# Patient Record
Sex: Female | Born: 1960
Health system: Southern US, Community
[De-identification: ages and names within clinical notes are randomized; demographics above are authoritative.]

## PROBLEM LIST (undated history)

## (undated) DIAGNOSIS — C801 Malignant (primary) neoplasm, unspecified: Secondary | ICD-10-CM

## (undated) DIAGNOSIS — I10 Essential (primary) hypertension: Secondary | ICD-10-CM

## (undated) DIAGNOSIS — E7800 Pure hypercholesterolemia, unspecified: Secondary | ICD-10-CM

## (undated) DIAGNOSIS — M810 Age-related osteoporosis without current pathological fracture: Secondary | ICD-10-CM

## (undated) DIAGNOSIS — M1990 Unspecified osteoarthritis, unspecified site: Secondary | ICD-10-CM

## (undated) HISTORY — DX: Pure hypercholesterolemia, unspecified: E78.00

## (undated) HISTORY — PX: COLONOSCOPY: SHX174

## (undated) HISTORY — DX: Age-related osteoporosis without current pathological fracture: M81.0

## (undated) HISTORY — DX: Malignant (primary) neoplasm, unspecified: C80.1

## (undated) HISTORY — PX: PELVIC LAPAROSCOPY: SHX162

## (undated) HISTORY — PX: OTHER SURGICAL HISTORY: SHX169

## (undated) HISTORY — DX: Essential (primary) hypertension: I10

---

## 1997-09-27 ENCOUNTER — Inpatient Hospital Stay (HOSPITAL_COMMUNITY): Admission: AD | Admit: 1997-09-27 | Discharge: 1997-09-27 | Payer: Self-pay | Admitting: Obstetrics and Gynecology

## 1998-02-23 ENCOUNTER — Encounter: Admission: RE | Admit: 1998-02-23 | Discharge: 1998-05-24 | Payer: Self-pay | Admitting: Gynecology

## 1998-07-11 ENCOUNTER — Inpatient Hospital Stay (HOSPITAL_COMMUNITY): Admission: AD | Admit: 1998-07-11 | Discharge: 1998-07-14 | Payer: Self-pay | Admitting: Gynecology

## 1998-07-18 ENCOUNTER — Encounter (HOSPITAL_COMMUNITY): Admission: RE | Admit: 1998-07-18 | Discharge: 1998-10-16 | Payer: Self-pay | Admitting: Gynecology

## 1998-09-01 ENCOUNTER — Other Ambulatory Visit: Admission: RE | Admit: 1998-09-01 | Discharge: 1998-09-01 | Payer: Self-pay | Admitting: Gynecology

## 2000-01-16 ENCOUNTER — Other Ambulatory Visit: Admission: RE | Admit: 2000-01-16 | Discharge: 2000-01-16 | Payer: Self-pay | Admitting: Obstetrics and Gynecology

## 2000-06-20 ENCOUNTER — Ambulatory Visit (HOSPITAL_BASED_OUTPATIENT_CLINIC_OR_DEPARTMENT_OTHER): Admission: RE | Admit: 2000-06-20 | Discharge: 2000-06-20 | Payer: Self-pay | Admitting: Plastic Surgery

## 2000-06-20 ENCOUNTER — Encounter (INDEPENDENT_AMBULATORY_CARE_PROVIDER_SITE_OTHER): Payer: Self-pay | Admitting: Specialist

## 2000-09-23 ENCOUNTER — Other Ambulatory Visit: Admission: RE | Admit: 2000-09-23 | Discharge: 2000-09-23 | Payer: Self-pay | Admitting: Gynecology

## 2000-10-02 ENCOUNTER — Encounter: Admission: RE | Admit: 2000-10-02 | Discharge: 2000-12-31 | Payer: Self-pay | Admitting: Gynecology

## 2001-04-23 ENCOUNTER — Inpatient Hospital Stay (HOSPITAL_COMMUNITY): Admission: AD | Admit: 2001-04-23 | Discharge: 2001-04-25 | Payer: Self-pay | Admitting: Gynecology

## 2001-06-04 ENCOUNTER — Other Ambulatory Visit: Admission: RE | Admit: 2001-06-04 | Discharge: 2001-06-04 | Payer: Self-pay | Admitting: Obstetrics and Gynecology

## 2002-05-27 ENCOUNTER — Other Ambulatory Visit: Admission: RE | Admit: 2002-05-27 | Discharge: 2002-05-27 | Payer: Self-pay | Admitting: Obstetrics and Gynecology

## 2003-09-21 ENCOUNTER — Other Ambulatory Visit: Admission: RE | Admit: 2003-09-21 | Discharge: 2003-09-21 | Payer: Self-pay | Admitting: Obstetrics and Gynecology

## 2003-10-05 ENCOUNTER — Ambulatory Visit (HOSPITAL_COMMUNITY): Admission: RE | Admit: 2003-10-05 | Discharge: 2003-10-05 | Payer: Self-pay | Admitting: Obstetrics and Gynecology

## 2003-10-21 ENCOUNTER — Encounter: Admission: RE | Admit: 2003-10-21 | Discharge: 2003-10-21 | Payer: Self-pay | Admitting: Obstetrics and Gynecology

## 2004-10-11 ENCOUNTER — Encounter: Admission: RE | Admit: 2004-10-11 | Discharge: 2004-10-11 | Payer: Self-pay | Admitting: Obstetrics and Gynecology

## 2004-10-18 ENCOUNTER — Other Ambulatory Visit: Admission: RE | Admit: 2004-10-18 | Discharge: 2004-10-18 | Payer: Self-pay | Admitting: Obstetrics and Gynecology

## 2005-10-22 ENCOUNTER — Other Ambulatory Visit: Admission: RE | Admit: 2005-10-22 | Discharge: 2005-10-22 | Payer: Self-pay | Admitting: Obstetrics and Gynecology

## 2005-10-23 ENCOUNTER — Encounter: Admission: RE | Admit: 2005-10-23 | Discharge: 2005-10-23 | Payer: Self-pay | Admitting: Obstetrics and Gynecology

## 2006-10-25 ENCOUNTER — Other Ambulatory Visit: Admission: RE | Admit: 2006-10-25 | Discharge: 2006-10-25 | Payer: Self-pay | Admitting: Obstetrics and Gynecology

## 2006-11-06 ENCOUNTER — Encounter: Admission: RE | Admit: 2006-11-06 | Discharge: 2006-11-06 | Payer: Self-pay | Admitting: Obstetrics and Gynecology

## 2007-11-04 ENCOUNTER — Other Ambulatory Visit: Admission: RE | Admit: 2007-11-04 | Discharge: 2007-11-04 | Payer: Self-pay | Admitting: Obstetrics and Gynecology

## 2007-11-25 ENCOUNTER — Ambulatory Visit (HOSPITAL_COMMUNITY): Admission: RE | Admit: 2007-11-25 | Discharge: 2007-11-25 | Payer: Self-pay | Admitting: Obstetrics and Gynecology

## 2008-11-05 ENCOUNTER — Encounter: Payer: Self-pay | Admitting: Obstetrics and Gynecology

## 2008-11-05 ENCOUNTER — Ambulatory Visit: Payer: Self-pay | Admitting: Obstetrics and Gynecology

## 2008-11-05 ENCOUNTER — Other Ambulatory Visit: Admission: RE | Admit: 2008-11-05 | Discharge: 2008-11-05 | Payer: Self-pay | Admitting: Obstetrics and Gynecology

## 2008-11-25 ENCOUNTER — Ambulatory Visit: Payer: Self-pay | Admitting: Obstetrics and Gynecology

## 2008-11-25 ENCOUNTER — Ambulatory Visit (HOSPITAL_COMMUNITY): Admission: RE | Admit: 2008-11-25 | Discharge: 2008-11-25 | Payer: Self-pay | Admitting: Obstetrics and Gynecology

## 2008-12-13 ENCOUNTER — Ambulatory Visit: Payer: Self-pay | Admitting: Obstetrics and Gynecology

## 2008-12-24 ENCOUNTER — Ambulatory Visit: Payer: Self-pay | Admitting: Obstetrics and Gynecology

## 2009-11-17 ENCOUNTER — Other Ambulatory Visit: Admission: RE | Admit: 2009-11-17 | Discharge: 2009-11-17 | Payer: Self-pay | Admitting: Obstetrics and Gynecology

## 2009-11-17 ENCOUNTER — Ambulatory Visit: Payer: Self-pay | Admitting: Obstetrics and Gynecology

## 2009-11-28 ENCOUNTER — Ambulatory Visit (HOSPITAL_COMMUNITY): Admission: RE | Admit: 2009-11-28 | Discharge: 2009-11-28 | Payer: Self-pay | Admitting: Obstetrics and Gynecology

## 2010-01-20 ENCOUNTER — Ambulatory Visit: Payer: Self-pay | Admitting: Obstetrics and Gynecology

## 2010-11-07 ENCOUNTER — Other Ambulatory Visit: Payer: Self-pay | Admitting: Obstetrics and Gynecology

## 2010-11-07 DIAGNOSIS — Z1231 Encounter for screening mammogram for malignant neoplasm of breast: Secondary | ICD-10-CM

## 2010-11-22 ENCOUNTER — Other Ambulatory Visit (HOSPITAL_COMMUNITY)
Admission: RE | Admit: 2010-11-22 | Discharge: 2010-11-22 | Disposition: A | Discharge status: Home / Self Care | Payer: PRIVATE HEALTH INSURANCE | Source: Ambulatory Visit | Attending: Obstetrics and Gynecology | Admitting: Obstetrics and Gynecology

## 2010-11-22 ENCOUNTER — Other Ambulatory Visit: Payer: Self-pay | Admitting: Obstetrics and Gynecology

## 2010-11-22 ENCOUNTER — Encounter (INDEPENDENT_AMBULATORY_CARE_PROVIDER_SITE_OTHER): Payer: PRIVATE HEALTH INSURANCE | Admitting: Obstetrics and Gynecology

## 2010-11-22 DIAGNOSIS — Z01419 Encounter for gynecological examination (general) (routine) without abnormal findings: Secondary | ICD-10-CM

## 2010-11-22 DIAGNOSIS — N912 Amenorrhea, unspecified: Secondary | ICD-10-CM

## 2010-11-22 DIAGNOSIS — Z124 Encounter for screening for malignant neoplasm of cervix: Secondary | ICD-10-CM | POA: Insufficient documentation

## 2010-12-12 ENCOUNTER — Ambulatory Visit (HOSPITAL_COMMUNITY)
Admission: RE | Admit: 2010-12-12 | Discharge: 2010-12-12 | Disposition: A | Discharge status: Home / Self Care | Payer: PRIVATE HEALTH INSURANCE | Source: Ambulatory Visit | Attending: Obstetrics and Gynecology | Admitting: Obstetrics and Gynecology

## 2010-12-12 DIAGNOSIS — Z1231 Encounter for screening mammogram for malignant neoplasm of breast: Secondary | ICD-10-CM

## 2010-12-13 ENCOUNTER — Ambulatory Visit (INDEPENDENT_AMBULATORY_CARE_PROVIDER_SITE_OTHER): Payer: PRIVATE HEALTH INSURANCE | Admitting: Obstetrics and Gynecology

## 2010-12-13 ENCOUNTER — Other Ambulatory Visit: Payer: Self-pay | Admitting: Obstetrics and Gynecology

## 2010-12-13 ENCOUNTER — Other Ambulatory Visit: Payer: PRIVATE HEALTH INSURANCE

## 2010-12-13 DIAGNOSIS — N938 Other specified abnormal uterine and vaginal bleeding: Secondary | ICD-10-CM

## 2010-12-13 DIAGNOSIS — N950 Postmenopausal bleeding: Secondary | ICD-10-CM

## 2010-12-13 DIAGNOSIS — N949 Unspecified condition associated with female genital organs and menstrual cycle: Secondary | ICD-10-CM

## 2011-01-05 NOTE — Discharge Summary (Signed)
[facility]  Patient:    Autumn Morrison, Autumn Morrison Visit Number: [id number] MRN: [id number]          Service Type: OBS Location: [id number] Attending Physician:  Merrily Pew Dictated by:   Antony Contras, N.P. Admit Date:  04/23/2001 Discharge Date: 04/25/2001                             Discharge Summary  DISCHARGE DIAGNOSES:          Intrauterine pregnancy at term, spontaneous onset of labor, normal spontaneous vaginal delivery over midline episiotomy with partial third degree laceration, delivery of viable infant.  HISTORY OF PRESENT ILLNESS:   The patient is a 50 year old gravida 2, para 1-0-0-1 with an LMP of July 16, 2000, [employer] April 22, 2001.  Prenatal risk factors include advanced maternal age, anemia, positive BV in first trimester.  PRENATAL LABORATORIES:        Blood type B+.  Antibody screen negative.  RPR, HBSAG, HIV nonreactive.  Rubella immune.  MSAFP normal.  Amniocentesis normal chromosomes.  GBS negative.  HOSPITAL COURSE:              Patient was admitted on April 23, 2001 with spontaneous onset of labor.  She progressed to complete dilatation and delivered an Apgar 9/9 female infant weighing 8 pounds 7 ounces over a midline episiotomy with a partial third degree.  Postpartum course patient remained afebrile.  Was able to be discharged in satisfactory condition on her second postpartum day.  LABORATORIES:                 CBC:  Hematocrit 23, hemoglobin 7.9, WBC 10.3, platelets 190,000.  DISPOSITION:                  Follow-up in six weeks.  Continue prenatal vitamins and iron, Motrin and Tylox for pain. Dictated by:   Antony Contras, N.P. Attending Physician:  Merrily Pew DD:  05/16/01 TD:  05/16/01 Job: [phone] [id number]

## 2011-12-19 ENCOUNTER — Other Ambulatory Visit: Payer: Self-pay | Admitting: Obstetrics and Gynecology

## 2011-12-19 ENCOUNTER — Encounter: Payer: Self-pay | Admitting: Gynecology

## 2011-12-19 DIAGNOSIS — Z1231 Encounter for screening mammogram for malignant neoplasm of breast: Secondary | ICD-10-CM

## 2011-12-19 DIAGNOSIS — E7800 Pure hypercholesterolemia, unspecified: Secondary | ICD-10-CM | POA: Insufficient documentation

## 2011-12-19 DIAGNOSIS — I10 Essential (primary) hypertension: Secondary | ICD-10-CM | POA: Insufficient documentation

## 2012-01-01 ENCOUNTER — Encounter: Payer: Self-pay | Admitting: Obstetrics and Gynecology

## 2012-01-01 ENCOUNTER — Ambulatory Visit (INDEPENDENT_AMBULATORY_CARE_PROVIDER_SITE_OTHER): Payer: Managed Care, Other (non HMO) | Admitting: Obstetrics and Gynecology

## 2012-01-01 VITALS — BP 140/80 | Ht 64.0 in | Wt 120.0 lb

## 2012-01-01 DIAGNOSIS — Z01419 Encounter for gynecological examination (general) (routine) without abnormal findings: Secondary | ICD-10-CM

## 2012-01-01 DIAGNOSIS — M8580 Other specified disorders of bone density and structure, unspecified site: Secondary | ICD-10-CM

## 2012-01-01 DIAGNOSIS — M949 Disorder of cartilage, unspecified: Secondary | ICD-10-CM

## 2012-01-01 DIAGNOSIS — M899 Disorder of bone, unspecified: Secondary | ICD-10-CM

## 2012-01-01 NOTE — Progress Notes (Signed)
Patient came to see me today for her annual GYN exam. She has scheduled her yearly mammogram. Her last bone density showed low bone mass without an elevated FRAX risk and she is due for followup bone density now. She has a normal vitamin D. She takes calcium and d. She has had no fractures. She has had a previous FSH of 106. She has now not had a cycle since last August. She is having hot flashes. She has very mild vaginal dryness. She is having no bleeding. She is having no pelvic pain or dyspareunia. For the moment she feels that her hot flashes did not require intervention. She does her lab through her PCP.  Physical examination: Kennon Portela present HEENT within normal limits. Neck: Thyroid not large. No masses. Supraclavicular nodes: not enlarged. Breasts: Examined in both sitting and lying  position. No skin changes and no masses. Abdomen: Soft no guarding rebound or masses or hernia. Pelvic: External: Within normal limits. BUS: Within normal limits. Vaginal:within normal limits. Good estrogen effect. No evidence of cystocele rectocele or enterocele. Cervix: clean. Uterus: Normal size and shape. Adnexa: No masses. Rectovaginal exam: Confirmatory and negative. Extremities: Within normal limits.  Assessment: Mild menopausal symptoms. Low bone mass.  Plan: Call when necessary need for HRT. We had a long discussion of pros and cons today. Mammogram. Bone density.

## 2012-01-02 LAB — URINALYSIS W MICROSCOPIC + REFLEX CULTURE
Bacteria, UA: NONE SEEN
Casts: NONE SEEN
Glucose, UA: NEGATIVE mg/dL
Hgb urine dipstick: NEGATIVE
Ketones, ur: NEGATIVE mg/dL
Nitrite: NEGATIVE
Protein, ur: NEGATIVE mg/dL

## 2012-01-03 ENCOUNTER — Other Ambulatory Visit: Payer: Self-pay | Admitting: Obstetrics and Gynecology

## 2012-01-03 DIAGNOSIS — N390 Urinary tract infection, site not specified: Secondary | ICD-10-CM

## 2012-01-03 MED ORDER — NITROFURANTOIN MONOHYD MACRO 100 MG PO CAPS: 100.0000 mg | ORAL_CAPSULE | Freq: Two times a day (BID) | ORAL | Status: AC

## 2012-01-04 LAB — URINE CULTURE: Colony Count: >=100000

## 2012-01-10 ENCOUNTER — Ambulatory Visit (HOSPITAL_COMMUNITY)
Admission: RE | Admit: 2012-01-10 | Discharge: 2012-01-10 | Disposition: A | Discharge status: Home / Self Care | Payer: Managed Care, Other (non HMO) | Source: Ambulatory Visit | Attending: Obstetrics and Gynecology | Admitting: Obstetrics and Gynecology

## 2012-01-10 DIAGNOSIS — Z1231 Encounter for screening mammogram for malignant neoplasm of breast: Secondary | ICD-10-CM

## 2012-01-17 ENCOUNTER — Other Ambulatory Visit: Payer: Managed Care, Other (non HMO)

## 2012-01-17 DIAGNOSIS — N390 Urinary tract infection, site not specified: Secondary | ICD-10-CM

## 2012-01-18 LAB — URINALYSIS W MICROSCOPIC + REFLEX CULTURE
Bacteria, UA: NONE SEEN
Bilirubin Urine: NEGATIVE
Crystals: NONE SEEN
Hgb urine dipstick: NEGATIVE
Ketones, ur: NEGATIVE mg/dL
Nitrite: NEGATIVE
Protein, ur: NEGATIVE mg/dL
Specific Gravity, Urine: 1.013 (ref 1.005–1.030)
Urobilinogen, UA: 0.2 mg/dL (ref 0.0–1.0)

## 2013-01-07 ENCOUNTER — Other Ambulatory Visit: Payer: Self-pay | Admitting: Gynecology

## 2013-01-07 DIAGNOSIS — Z1231 Encounter for screening mammogram for malignant neoplasm of breast: Secondary | ICD-10-CM

## 2013-01-16 ENCOUNTER — Ambulatory Visit (HOSPITAL_COMMUNITY)
Admission: RE | Admit: 2013-01-16 | Discharge: 2013-01-16 | Disposition: A | Discharge status: Home / Self Care | Payer: Medicare HMO | Source: Ambulatory Visit | Attending: Gynecology | Admitting: Gynecology

## 2013-01-16 DIAGNOSIS — Z1231 Encounter for screening mammogram for malignant neoplasm of breast: Secondary | ICD-10-CM

## 2013-01-20 ENCOUNTER — Other Ambulatory Visit: Payer: Self-pay | Admitting: *Deleted

## 2013-01-20 DIAGNOSIS — N6310 Unspecified lump in the right breast, unspecified quadrant: Secondary | ICD-10-CM

## 2013-01-20 DIAGNOSIS — R928 Other abnormal and inconclusive findings on diagnostic imaging of breast: Secondary | ICD-10-CM

## 2013-02-05 ENCOUNTER — Ambulatory Visit
Admission: RE | Admit: 2013-02-05 | Discharge: 2013-02-05 | Disposition: A | Discharge status: Home / Self Care | Payer: Managed Care, Other (non HMO) | Source: Ambulatory Visit | Attending: Gynecology | Admitting: Gynecology

## 2013-02-05 DIAGNOSIS — R928 Other abnormal and inconclusive findings on diagnostic imaging of breast: Secondary | ICD-10-CM

## 2013-02-05 DIAGNOSIS — N6310 Unspecified lump in the right breast, unspecified quadrant: Secondary | ICD-10-CM

## 2013-03-30 ENCOUNTER — Ambulatory Visit (INDEPENDENT_AMBULATORY_CARE_PROVIDER_SITE_OTHER): Payer: Managed Care, Other (non HMO) | Admitting: Gynecology

## 2013-03-30 ENCOUNTER — Encounter: Payer: Self-pay | Admitting: Gynecology

## 2013-03-30 VITALS — BP 120/74 | Ht 65.0 in | Wt 121.0 lb

## 2013-03-30 DIAGNOSIS — M8580 Other specified disorders of bone density and structure, unspecified site: Secondary | ICD-10-CM

## 2013-03-30 DIAGNOSIS — N898 Other specified noninflammatory disorders of vagina: Secondary | ICD-10-CM

## 2013-03-30 DIAGNOSIS — Z01419 Encounter for gynecological examination (general) (routine) without abnormal findings: Secondary | ICD-10-CM

## 2013-03-30 DIAGNOSIS — Z13220 Encounter for screening for lipoid disorders: Secondary | ICD-10-CM

## 2013-03-30 DIAGNOSIS — M899 Disorder of bone, unspecified: Secondary | ICD-10-CM

## 2013-03-30 LAB — WET PREP FOR TRICH, YEAST, CLUE
Trich, Wet Prep: NONE SEEN
Yeast Wet Prep HPF POC: NONE SEEN

## 2013-03-30 NOTE — Patient Instructions (Signed)
Followup for bone density as arranged. Schedule colonoscopy with [facility] gastroenterology at [phone] or [facility] gastroenterology at [phone]

## 2013-03-30 NOTE — Progress Notes (Signed)
TAHLIA DEAMER May 16, 1961 [id number]        52 y.o.  [id number] for annual exam.  Former patient of Dr. Eda Paschal. Several issues noted below.  Past medical history,surgical history, medications, allergies, family history and social history were all reviewed and documented in the EPIC chart.  ROS:  Performed and pertinent positives and negatives are included in the history, assessment and plan .  Exam: Kim assistant Filed Vitals:   03/30/13 1523  BP: 120/74  Height: 5\' 5"  (1.651 m)  Weight: 121 lb (54.885 kg)   General appearance  Normal Skin grossly normal Head/Neck normal with no cervical or supraclavicular adenopathy thyroid normal Lungs  clear Cardiac RR, without RMG Abdominal  soft, nontender, without masses, organomegaly or hernia Breasts  examined lying and sitting without masses, retractions, discharge or axillary adenopathy. Pelvic  Ext/BUS/vagina  normal with atrophic changes  Cervix  normal with mild atrophic changes  Uterus  anteverted, normal size, shape and contour, midline and mobile nontender   Adnexa  Without masses or tenderness    Anus and perineum  normal   Rectovaginal  normal sphincter tone without palpated masses or tenderness.    Assessment/Plan:  52 y.o. [id number] female for annual exam.   1. Postmenopausal. LMP 2 years ago. Elevated FSH in the past. Occasional menopausal symptoms such as hot flashes or sweats but not overly bothersome to the patient. No issues with dyspareunia or vaginal dryness. We'll continue to monitor at present. Patient knows to report any vaginal bleeding. 2. Vaginal moisture for several days. No overt discharge itching or odor. No UTI symptoms. Wet prep is negative. Will observe at present. She does not feel that its urine. If continues or worsens she'll represent for evaluation. Check UA today. 3. Osteopenia. DEXA 11/2008 with T score -2.2 FRAX 3.5%/0.8%. Never followed up for repeat. Recommend repeat DEXA now and she agrees to do so.  Check vitamin D level today. Increase calcium vitamin D recommendations reviewed. 4. Pap smear 2012. No Pap smear done today. No history of abnormal Pap smears previously. And repeat Pap smear next year 3 year interval. 5. Mammography 01/2013. Continue with annual mammography. SBE monthly reviewed. 6. Colonoscopy never. Patient turned 52 this year I recommended arranging screening colonoscopy and she agrees with this. 7. Health maintenance. Has not had routine blood work done recently. Will check baseline CBC comprehensive metabolic panel lipid profile urinalysis and vitamin D. Followup for DEXA as arranged otherwise annually.  Note: This document was prepared with digital dictation and possible smart phrase technology. Any transcriptional errors that result from this process are unintentional.   Dara Lords MD, 3:53 PM 03/30/2013

## 2013-03-31 ENCOUNTER — Other Ambulatory Visit: Payer: Managed Care, Other (non HMO)

## 2013-03-31 ENCOUNTER — Encounter: Payer: Self-pay | Admitting: Obstetrics and Gynecology

## 2013-03-31 LAB — LIPID PANEL
Cholesterol: 205 mg/dL — ABNORMAL HIGH (ref 0–200)
HDL: 58 mg/dL (ref >39)
LDL Cholesterol: 135 mg/dL — ABNORMAL HIGH (ref 0–99)
Total CHOL/HDL Ratio: 3.5 Ratio
Triglycerides: 60 mg/dL (ref <150)
VLDL: 12 mg/dL (ref 0–40)

## 2013-03-31 LAB — CBC WITH DIFFERENTIAL/PLATELET
Basophils Relative: 0 % (ref 0–1)
HCT: 41.1 % (ref 36.0–46.0)
Hemoglobin: 13.9 g/dL (ref 12.0–15.0)
Lymphocytes Relative: 47 % — ABNORMAL HIGH (ref 12–46)
Lymphs Abs: 2.3 10*3/uL (ref 0.7–4.0)
MCHC: 33.8 g/dL (ref 30.0–36.0)
Monocytes Absolute: 0.2 10*3/uL (ref 0.1–1.0)
Monocytes Relative: 3 % (ref 3–12)
Neutro Abs: 2.4 10*3/uL (ref 1.7–7.7)
Neutrophils Relative %: 49 % (ref 43–77)
RBC: 4.84 MIL/uL (ref 3.87–5.11)
WBC: 4.9 10*3/uL (ref 4.0–10.5)

## 2013-03-31 LAB — COMPREHENSIVE METABOLIC PANEL
Alkaline Phosphatase: 69 U/L (ref 39–117)
CO2: 28 mEq/L (ref 19–32)
Creat: 0.72 mg/dL (ref 0.50–1.10)
Glucose, Bld: 92 mg/dL (ref 70–99)
Total Bilirubin: 0.4 mg/dL (ref 0.3–1.2)

## 2013-03-31 LAB — URINALYSIS W MICROSCOPIC + REFLEX CULTURE
Bacteria, UA: NONE SEEN
Bilirubin Urine: NEGATIVE
Crystals: NONE SEEN
Hgb urine dipstick: NEGATIVE
Ketones, ur: NEGATIVE mg/dL
Protein, ur: NEGATIVE mg/dL
Urobilinogen, UA: 0.2 mg/dL (ref 0.0–1.0)

## 2013-04-01 LAB — VITAMIN D 25 HYDROXY (VIT D DEFICIENCY, FRACTURES): Vit D, 25-Hydroxy: 44 ng/mL (ref 30–89)

## 2013-05-21 ENCOUNTER — Ambulatory Visit (INDEPENDENT_AMBULATORY_CARE_PROVIDER_SITE_OTHER): Payer: Managed Care, Other (non HMO)

## 2013-05-21 DIAGNOSIS — M8580 Other specified disorders of bone density and structure, unspecified site: Secondary | ICD-10-CM

## 2013-05-21 DIAGNOSIS — M810 Age-related osteoporosis without current pathological fracture: Secondary | ICD-10-CM | POA: Insufficient documentation

## 2013-05-25 ENCOUNTER — Telehealth: Payer: Self-pay | Admitting: Gynecology

## 2013-05-25 ENCOUNTER — Encounter: Payer: Self-pay | Admitting: Gynecology

## 2013-05-25 DIAGNOSIS — M810 Age-related osteoporosis without current pathological fracture: Secondary | ICD-10-CM

## 2013-05-25 NOTE — Telephone Encounter (Signed)
Left message for pt to call.

## 2013-05-25 NOTE — Telephone Encounter (Signed)
Recommend office visit to discuss her bone density which shows osteoporosis. Recommend comprehensive metabolic panel, TSH, PTH, vitamin D before hand

## 2013-06-02 NOTE — Telephone Encounter (Signed)
Pt informed labs ordered and will schedule consult when comes in for lab.

## 2013-06-04 ENCOUNTER — Ambulatory Visit: Payer: Managed Care, Other (non HMO)

## 2013-06-04 DIAGNOSIS — M810 Age-related osteoporosis without current pathological fracture: Secondary | ICD-10-CM

## 2013-06-04 LAB — TSH: TSH: 1.069 u[IU]/mL (ref 0.350–4.500)

## 2013-06-04 LAB — COMPREHENSIVE METABOLIC PANEL
ALT: 13 U/L (ref 0–35)
Alkaline Phosphatase: 67 U/L (ref 39–117)
CO2: 29 mEq/L (ref 19–32)
Calcium: 9.1 mg/dL (ref 8.4–10.5)
Chloride: 100 mEq/L (ref 96–112)
Sodium: 136 mEq/L (ref 135–145)
Total Bilirubin: 0.4 mg/dL (ref 0.3–1.2)
Total Protein: 7.1 g/dL (ref 6.0–8.3)

## 2013-06-08 ENCOUNTER — Other Ambulatory Visit: Payer: Self-pay | Admitting: Gynecology

## 2013-06-08 ENCOUNTER — Other Ambulatory Visit: Payer: Managed Care, Other (non HMO)

## 2013-06-08 DIAGNOSIS — M810 Age-related osteoporosis without current pathological fracture: Secondary | ICD-10-CM

## 2013-06-09 LAB — PTH, INTACT AND CALCIUM: Calcium: 9.7 mg/dL (ref 8.4–10.5)

## 2013-06-16 ENCOUNTER — Encounter: Payer: Self-pay | Admitting: Gynecology

## 2013-06-16 ENCOUNTER — Ambulatory Visit (INDEPENDENT_AMBULATORY_CARE_PROVIDER_SITE_OTHER): Payer: Managed Care, Other (non HMO) | Admitting: Gynecology

## 2013-06-16 DIAGNOSIS — M810 Age-related osteoporosis without current pathological fracture: Secondary | ICD-10-CM

## 2013-06-16 MED ORDER — ALENDRONATE SODIUM 70 MG PO TABS: 70.0000 mg | ORAL_TABLET | ORAL | Status: DC

## 2013-06-16 NOTE — Patient Instructions (Signed)
Osteoporosis Throughout your life, your body breaks down old bone and replaces it with new bone. As you get older, your body does not replace bone as quickly as it breaks it down. By the age of 30 years, most people begin to gradually lose bone because of the imbalance between bone loss and replacement. Some people lose more bone than others. Bone loss beyond a specified normal degree is considered osteoporosis.  Osteoporosis affects the strength and durability of your bones. The inside of the ends of your bones and your flat bones, like the bones of your pelvis, look like honeycomb, filled with tiny open spaces. As bone loss occurs, your bones become less dense. This means that the open spaces inside your bones become bigger and the walls between these spaces become thinner. This makes your bones weaker. Bones of a person with osteoporosis can become so weak that they can break (fracture) during minor accidents, such as a simple fall. CAUSES  The following factors have been associated with the development of osteoporosis:  Smoking.  Drinking more than 2 alcoholic drinks several days per week.  Long-term use of certain medicines:  Corticosteroids.  Chemotherapy medicines.  Thyroid medicines.  Antiepileptic medicines.  Gonadal hormone suppression medicine.  Immunosuppression medicine.  Being underweight.  Lack of physical activity.  Lack of exposure to the sun. This can lead to vitamin D deficiency.  Certain medical conditions:  Certain inflammatory bowel diseases, such as Crohn's disease and ulcerative colitis.  Diabetes.  Hyperthyroidism.  Hyperparathyroidism. RISK FACTORS Anyone can develop osteoporosis. However, the following factors can increase your risk of developing osteoporosis:  Gender Women are at higher risk than men.  Age Being older than 50 years increases your risk.  Ethnicity White and Asian people have an increased risk.  Weight Being extremely  underweight can increase your risk of osteoporosis.  Family history of osteoporosis Having a family member who has developed osteoporosis can increase your risk. SYMPTOMS  Usually, people with osteoporosis have no symptoms.  DIAGNOSIS  Signs during a physical exam that may prompt your caregiver to suspect osteoporosis include:  Decreased height. This is usually caused by the compression of the bones that form your spine (vertebrae) because they have weakened and become fractured.  A curving or rounding of the upper back (kyphosis). To confirm signs of osteoporosis, your caregiver may request a procedure that uses 2 low-dose X-ray beams with different levels of energy to measure your bone mineral density (dual-energy X-ray absorptiometry [DXA]). Also, your caregiver may check your level of vitamin D. TREATMENT  The goal of osteoporosis treatment is to strengthen bones in order to decrease the risk of bone fractures. There are different types of medicines available to help achieve this goal. Some of these medicines work by slowing the processes of bone loss. Some medicines work by increasing bone density. Treatment also involves making sure that your levels of calcium and vitamin D are adequate. PREVENTION  There are things you can do to help prevent osteoporosis. Adequate intake of calcium and vitamin D can help you achieve optimal bone mineral density. Regular exercise can also help, especially resistance and weight-bearing activities. If you smoke, quitting smoking is an important part of osteoporosis prevention. MAKE SURE YOU:  Understand these instructions.  Will watch your condition.  Will get help right away if you are not doing well or get worse. Document Released: 05/16/2005 Document Revised: 07/23/2012 Document Reviewed: 07/21/2011 [employer] Patient Information 2014 [employer].  

## 2013-06-16 NOTE — Progress Notes (Signed)
Patient presents to discuss her most recent DEXA which shows T score -2.5 left femoral neck. There was a statistically significant decline from her prior DEXA. Z score normal. TSH PTH calcium and vitamin D levels normal. She does have a strong family history of osteoporosis  I reviewed the diagnosis of osteoporosis and the options for management. Nonpharmacologic such as weightbearing exercise adequate calcium vitamin D reviewed. Pharmacologic treatments to include bisphosphonates, Prolia, Forteo calcitonin HRT Evista discussed. After lengthy discussion as to the side effect profile and risks including GERD, esophageal cancer, osteonecrosis of the jaw, atypical fractures particularly with prolonged use the patient wants to start alendronate 70 mg weekly. I reviewed how to take the medication. She is going to check to see if Actonel would be cheap enough to consider she may call back to switch to monthly Actonel 150 mg. Plan repeat DEXA 2 years.

## 2013-06-25 ENCOUNTER — Other Ambulatory Visit: Payer: Self-pay

## 2013-06-30 ENCOUNTER — Telehealth: Payer: Self-pay | Admitting: *Deleted

## 2013-06-30 NOTE — Telephone Encounter (Signed)
Muscle aches can occur with the pill. I would try another dose and see how she does. If this continues to be an issue then we may want to talk about switching her to a different medication.

## 2013-06-30 NOTE — Telephone Encounter (Signed)
Pt started on fosamax 70 mg weekly on 06/16/13 took first week of pill and done fine, pt said she took this week pill and has had some body aching. Pt said it comes & goes mainly aching in neck and tailbone area, pt asked if she should wait a little longer to be on medication? Recommendations? Please advise

## 2013-06-30 NOTE — Telephone Encounter (Signed)
Pt informed with the below note. She will follow up if needed. 

## 2013-12-08 ENCOUNTER — Other Ambulatory Visit: Payer: Self-pay | Admitting: Gynecology

## 2013-12-08 DIAGNOSIS — R921 Mammographic calcification found on diagnostic imaging of breast: Secondary | ICD-10-CM

## 2014-01-19 ENCOUNTER — Ambulatory Visit
Admission: RE | Admit: 2014-01-19 | Discharge: 2014-01-19 | Disposition: A | Discharge status: Home / Self Care | Payer: Private Health Insurance - Indemnity | Source: Ambulatory Visit | Attending: Gynecology | Admitting: Gynecology

## 2014-01-19 DIAGNOSIS — R921 Mammographic calcification found on diagnostic imaging of breast: Secondary | ICD-10-CM

## 2014-06-17 ENCOUNTER — Other Ambulatory Visit: Payer: Self-pay | Admitting: Gynecology

## 2014-06-21 ENCOUNTER — Encounter: Payer: Self-pay | Admitting: Gynecology

## 2014-09-14 ENCOUNTER — Other Ambulatory Visit (HOSPITAL_COMMUNITY)
Admission: RE | Admit: 2014-09-14 | Discharge: 2014-09-14 | Disposition: A | Discharge status: Home / Self Care | Payer: No Typology Code available for payment source | Source: Ambulatory Visit | Attending: Gynecology | Admitting: Gynecology

## 2014-09-14 ENCOUNTER — Encounter: Payer: Self-pay | Admitting: Gynecology

## 2014-09-14 ENCOUNTER — Ambulatory Visit (INDEPENDENT_AMBULATORY_CARE_PROVIDER_SITE_OTHER): Payer: No Typology Code available for payment source | Admitting: Gynecology

## 2014-09-14 VITALS — BP 140/86 | Ht 64.0 in | Wt 119.0 lb

## 2014-09-14 DIAGNOSIS — Z1151 Encounter for screening for human papillomavirus (HPV): Secondary | ICD-10-CM | POA: Diagnosis present

## 2014-09-14 DIAGNOSIS — Z01419 Encounter for gynecological examination (general) (routine) without abnormal findings: Secondary | ICD-10-CM | POA: Insufficient documentation

## 2014-09-14 DIAGNOSIS — M810 Age-related osteoporosis without current pathological fracture: Secondary | ICD-10-CM

## 2014-09-14 MED ORDER — ALENDRONATE SODIUM 70 MG PO TABS: ORAL_TABLET | ORAL | Status: DC

## 2014-09-14 NOTE — Progress Notes (Signed)
[place] 1961/06/08 [id number]        54 y.o.  [id number] for annual exam.  Several issues noted below.  Past medical history,surgical history, problem list, medications, allergies, family history and social history were all reviewed and documented as reviewed in the EPIC chart.  ROS:  Performed with pertinent positives and negatives included in the history, assessment and plan.   Additional significant findings :  none   Exam: Kim Counsellor Vitals:   09/14/14 1007  BP: 140/86  Height: 5\' 4"  (1.626 m)  Weight: 119 lb (53.978 kg)   General appearance:  Normal affect, orientation and appearance. Skin: Grossly normal HEENT: Without gross lesions.  No cervical or supraclavicular adenopathy. Thyroid normal.  Lungs:  Clear without wheezing, rales or rhonchi Cardiac: RR, without RMG Abdominal:  Soft, nontender, without masses, guarding, rebound, organomegaly or hernia Breasts:  Examined lying and sitting without masses, retractions, discharge or axillary adenopathy. Pelvic:  Ext/BUS/vagina with mild atrophic changes  Cervix normal. Pap smear/HPV done  Uterus axial, normal size, shape and contour, midline and mobile nontender   Adnexa  Without masses or tenderness    Anus and perineum  Normal   Rectovaginal  Normal sphincter tone without palpated masses or tenderness.    Assessment/Plan:  54 y.o. [id number] female for annual exam.   1. Postmenopausal. Doing well without significant hot flushes, night sweats, vaginal dryness or dyspareunia. No vaginal bleeding. Continue to monitor. Report any vaginal bleeding. 2. Osteoporosis. DEXA 2014 T score -2.5. It started on Fosamax 70 mg weekly last year. Prescription ran out and she did not continue since October. Refill 1 year provided. Patient will go ahead and start again. Need to stay on the medication stressed and call if prescription runs out. Plan repeat DEXA next year at two-year interval. 3. Mammography 01/2014. Continue with  annual mammography. SBE monthly reviewed. 4. Pap smear 2012.  Pap/HPV today. No history of significant abnormal Pap smears.  Repeat at 3-5 year interval assuming this Pap smear is normal. 5. Colonoscopy never. Patient's planning to schedule this coming year. 6. Health maintenance. Patient plans routine blood work through Dr. Fayrene Fearing office. Mild elevated blood pressure reviewed with her. She had been on antihypertensives in the past. Going to follow up with Dr. Melford Aase reference to this. Follow up in one year, sooner as needed.     [name] MD, 10:28 AM 09/14/2014

## 2014-09-14 NOTE — Patient Instructions (Signed)
Schedule your colonoscopy.  You may obtain a copy of any labs that were done today by logging onto MyChart as outlined in the instructions provided with your AVS (after visit summary). The office will not call with normal lab results but certainly if there are any significant abnormalities then we will contact you.   Health Maintenance, Female A healthy lifestyle and preventative care can promote health and wellness.  Maintain regular health, dental, and eye exams.  Eat a healthy diet. Foods like vegetables, fruits, whole grains, low-fat dairy products, and lean protein foods contain the nutrients you need without too many calories. Decrease your intake of foods high in solid fats, added sugars, and salt. Get information about a proper diet from your caregiver, if necessary.  Regular physical exercise is one of the most important things you can do for your health. Most adults should get at least 150 minutes of moderate-intensity exercise (any activity that increases your heart rate and causes you to sweat) each week. In addition, most adults need muscle-strengthening exercises on 2 or more days a week.   Maintain a healthy weight. The body mass index (BMI) is a screening tool to identify possible weight problems. It provides an estimate of body fat based on height and weight. Your caregiver can help determine your BMI, and can help you achieve or maintain a healthy weight. For adults 20 years and older:  A BMI below 18.5 is considered underweight.  A BMI of 18.5 to 24.9 is normal.  A BMI of 25 to 29.9 is considered overweight.  A BMI of 30 and above is considered obese.  Maintain normal blood lipids and cholesterol by exercising and minimizing your intake of saturated fat. Eat a balanced diet with plenty of fruits and vegetables. Blood tests for lipids and cholesterol should begin at age 55 and be repeated every 5 years. If your lipid or cholesterol levels are high, you are over 50, or you  are a high risk for heart disease, you may need your cholesterol levels checked more frequently.Ongoing high lipid and cholesterol levels should be treated with medicines if diet and exercise are not effective.  If you smoke, find out from your caregiver how to quit. If you do not use tobacco, do not start.  Lung cancer screening is recommended for adults aged 78 80 years who are at high risk for developing lung cancer because of a history of smoking. Yearly low-dose computed tomography (CT) is recommended for people who have at least a 30-pack-year history of smoking and are a current smoker or have quit within the past 15 years. A pack year of smoking is smoking an average of 1 pack of cigarettes a day for 1 year (for example: 1 pack a day for 30 years or 2 packs a day for 15 years). Yearly screening should continue until the smoker has stopped smoking for at least 15 years. Yearly screening should also be stopped for people who develop a health problem that would prevent them from having lung cancer treatment.  If you are pregnant, do not drink alcohol. If you are breastfeeding, be very cautious about drinking alcohol. If you are not pregnant and choose to drink alcohol, do not exceed 1 drink per day. One drink is considered to be 12 ounces (355 mL) of beer, 5 ounces (148 mL) of wine, or 1.5 ounces (44 mL) of liquor.  Avoid use of street drugs. Do not share needles with anyone. Ask for help if you need support  or instructions about stopping the use of drugs.  High blood pressure causes heart disease and increases the risk of stroke. Blood pressure should be checked at least every 1 to 2 years. Ongoing high blood pressure should be treated with medicines, if weight loss and exercise are not effective.  If you are 58 to 52 years old, ask your caregiver if you should take aspirin to prevent strokes.  Diabetes screening involves taking a blood sample to check your fasting blood sugar level. This should  be done once every 3 years, after age 67, if you are within normal weight and without risk factors for diabetes. Testing should be considered at a younger age or be carried out more frequently if you are overweight and have at least 1 risk factor for diabetes.  Breast cancer screening is essential preventative care for women. You should practice "breast self-awareness." This means understanding the normal appearance and feel of your breasts and may include breast self-examination. Any changes detected, no matter how small, should be reported to a caregiver. Women in their 25s and 30s should have a clinical breast exam (CBE) by a caregiver as part of a regular health exam every 1 to 3 years. After age 37, women should have a CBE every year. Starting at age 12, women should consider having a mammogram (breast X-ray) every year. Women who have a family history of breast cancer should talk to their caregiver about genetic screening. Women at a high risk of breast cancer should talk to their caregiver about having an MRI and a mammogram every year.  Breast cancer gene (BRCA)-related cancer risk assessment is recommended for women who have family members with BRCA-related cancers. BRCA-related cancers include breast, ovarian, tubal, and peritoneal cancers. Having family members with these cancers may be associated with an increased risk for harmful changes (mutations) in the breast cancer genes BRCA1 and BRCA2. Results of the assessment will determine the need for genetic counseling and BRCA1 and BRCA2 testing.  The Pap test is a screening test for cervical cancer. Women should have a Pap test starting at age 47. Between ages 60 and 16, Pap tests should be repeated every 2 years. Beginning at age 6, you should have a Pap test every 3 years as long as the past 3 Pap tests have been normal. If you had a hysterectomy for a problem that was not cancer or a condition that could lead to cancer, then you no longer need  Pap tests. If you are between ages 19 and 37, and you have had normal Pap tests going back 10 years, you no longer need Pap tests. If you have had past treatment for cervical cancer or a condition that could lead to cancer, you need Pap tests and screening for cancer for at least 20 years after your treatment. If Pap tests have been discontinued, risk factors (such as a new sexual partner) need to be reassessed to determine if screening should be resumed. Some women have medical problems that increase the chance of getting cervical cancer. In these cases, your caregiver may recommend more frequent screening and Pap tests.  The human papillomavirus (HPV) test is an additional test that may be used for cervical cancer screening. The HPV test looks for the virus that can cause the cell changes on the cervix. The cells collected during the Pap test can be tested for HPV. The HPV test could be used to screen women aged 24 years and older, and should be used in  women of any age who have unclear Pap test results. After the age of 68, women should have HPV testing at the same frequency as a Pap test.  Colorectal cancer can be detected and often prevented. Most routine colorectal cancer screening begins at the age of 49 and continues through age 70. However, your caregiver may recommend screening at an earlier age if you have risk factors for colon cancer. On a yearly basis, your caregiver may provide home test kits to check for hidden blood in the stool. Use of a small camera at the end of a tube, to directly examine the colon (sigmoidoscopy or colonoscopy), can detect the earliest forms of colorectal cancer. Talk to your caregiver about this at age 62, when routine screening begins. Direct examination of the colon should be repeated every 5 to 10 years through age 62, unless early forms of pre-cancerous polyps or small growths are found.  Hepatitis C blood testing is recommended for all people born from [birth year] through  1965 and any individual with known risks for hepatitis C.  Practice safe sex. Use condoms and avoid high-risk sexual practices to reduce the spread of sexually transmitted infections (STIs). Sexually active women aged 55 and younger should be checked for Chlamydia, which is a common sexually transmitted infection. Older women with new or multiple partners should also be tested for Chlamydia. Testing for other STIs is recommended if you are sexually active and at increased risk.  Osteoporosis is a disease in which the bones lose minerals and strength with aging. This can result in serious bone fractures. The risk of osteoporosis can be identified using a bone density scan. Women ages 24 and over and women at risk for fractures or osteoporosis should discuss screening with their caregivers. Ask your caregiver whether you should be taking a calcium supplement or vitamin D to reduce the rate of osteoporosis.  Menopause can be associated with physical symptoms and risks. Hormone replacement therapy is available to decrease symptoms and risks. You should talk to your caregiver about whether hormone replacement therapy is right for you.  Use sunscreen. Apply sunscreen liberally and repeatedly throughout the day. You should seek shade when your shadow is shorter than you. Protect yourself by wearing long sleeves, pants, a wide-brimmed hat, and sunglasses year round, whenever you are outdoors.  Notify your caregiver of new moles or changes in moles, especially if there is a change in shape or color. Also notify your caregiver if a mole is larger than the size of a pencil eraser.  Stay current with your immunizations. Document Released: 02/19/2011 Document Revised: 12/01/2012 Document Reviewed: 02/19/2011 [facility] Patient Information 2014 [employer].

## 2014-09-14 NOTE — Addendum Note (Signed)
Addended by: Nelva Nay on: 09/14/2014 10:52 AM   Modules accepted: Orders

## 2014-09-15 LAB — CYTOLOGY - PAP

## 2015-01-04 ENCOUNTER — Other Ambulatory Visit: Payer: Self-pay | Admitting: Gynecology

## 2015-01-04 DIAGNOSIS — Z1231 Encounter for screening mammogram for malignant neoplasm of breast: Secondary | ICD-10-CM

## 2015-01-24 ENCOUNTER — Ambulatory Visit (HOSPITAL_COMMUNITY)
Admission: RE | Admit: 2015-01-24 | Discharge: 2015-01-24 | Disposition: A | Discharge status: Home / Self Care | Payer: No Typology Code available for payment source | Source: Ambulatory Visit | Attending: Gynecology | Admitting: Gynecology

## 2015-01-24 DIAGNOSIS — Z1231 Encounter for screening mammogram for malignant neoplasm of breast: Secondary | ICD-10-CM | POA: Insufficient documentation

## 2015-06-24 ENCOUNTER — Encounter: Payer: Self-pay | Admitting: Internal Medicine

## 2015-06-24 ENCOUNTER — Telehealth: Payer: Self-pay | Admitting: *Deleted

## 2015-06-24 NOTE — Telephone Encounter (Signed)
Pt called requesting name and # to GI Md, I gave patient [facility] and [phone] to call to schedule screening colonscopy

## 2015-08-25 ENCOUNTER — Ambulatory Visit (AMBULATORY_SURGERY_CENTER): Payer: Self-pay

## 2015-08-25 VITALS — Ht 64.0 in | Wt 121.8 lb

## 2015-08-25 DIAGNOSIS — Z1211 Encounter for screening for malignant neoplasm of colon: Secondary | ICD-10-CM

## 2015-08-25 MED ORDER — SUPREP BOWEL PREP KIT 17.5-3.13-1.6 GM/177ML PO SOLN: 1.0000 | Freq: Once | ORAL | Status: DC

## 2015-08-25 NOTE — Progress Notes (Signed)
No allergies to eggs or soy No home oxygen No diet/weight loss meds No past problems with anesthesia   Has email and internet; registered for emmi

## 2015-09-07 ENCOUNTER — Telehealth: Payer: Self-pay

## 2015-09-07 NOTE — Telephone Encounter (Signed)
Patient's insurance has changed and no longer covers her prep.  I left a message saying I would leave suprep up front for her to pick up.  I asked her to call me back to let me know she got the message and when she could pick it up.

## 2015-09-08 ENCOUNTER — Ambulatory Visit (AMBULATORY_SURGERY_CENTER): Payer: BLUE CROSS/BLUE SHIELD | Admitting: Internal Medicine

## 2015-09-08 ENCOUNTER — Encounter: Payer: Self-pay | Admitting: Internal Medicine

## 2015-09-08 VITALS — BP 133/76 | HR 68 | Temp 96.9°F | Resp 30 | Ht 64.0 in | Wt 121.0 lb

## 2015-09-08 DIAGNOSIS — Z1211 Encounter for screening for malignant neoplasm of colon: Secondary | ICD-10-CM | POA: Diagnosis not present

## 2015-09-08 MED ORDER — SODIUM CHLORIDE 0.9 % IV SOLN: 500.0000 mL | INTRAVENOUS | Status: DC

## 2015-09-08 NOTE — Op Note (Signed)
[place]  [address] [place], [id number]   COLONOSCOPY PROCEDURE REPORT  PATIENT: Autumn Morrison, Autumn Morrison  MR#: [id number] BIRTHDATE: 09/22/1960 , 3  yrs. old GENDER: female ENDOSCOPIST: Jerene Bears, MD REFERRED [id number] Melford Aase, M.D. PROCEDURE DATE:  09/08/2015 PROCEDURE:   Colonoscopy, screening First Screening Colonoscopy - Avg.  risk and is 50 yrs.  old or older Yes.  Prior Negative Screening - Now for repeat screening. N/A  History of Adenoma - Now for follow-up colonoscopy & has been > or = to 3 yrs.  N/A  Polyps removed today? No Recommend repeat exam, <10 yrs? No ASA CLASS:   Class II INDICATIONS:Screening for colonic neoplasia and Colorectal Neoplasm Risk Assessment for this procedure is average risk. MEDICATIONS: Monitored anesthesia care and Propofol 200 mg IV  DESCRIPTION OF PROCEDURE:   After the risks benefits and alternatives of the procedure were thoroughly explained, informed consent was obtained.  The digital rectal exam revealed no abnormalities of the rectum.   The LB [id number]  endoscope was introduced through the anus and advanced to the cecum, which was identified by both the appendix and ileocecal valve. No adverse events experienced.   The quality of the prep was (Suprep was used) excellent.  The instrument was then slowly withdrawn as the colon was fully examined. Estimated blood loss is zero unless otherwise noted in this procedure report.      COLON FINDINGS: A normal appearing cecum, ileocecal valve, and appendiceal orifice were identified.  the ascending, transverse, descending, sigmoid colon, and rectum appeared unremarkable. Retroflexed views revealed small skin tags and no abnormalities. The time to cecum = 3.7 Withdrawal time = 8.6   The scope was withdrawn and the procedure completed.  COMPLICATIONS: There were no complications.  ENDOSCOPIC IMPRESSION: Normal colonoscopy  RECOMMENDATIONS: You should continue  to follow colorectal cancer screening guidelines for "routine risk" patients with a repeat colonoscopy in 10 years. There is no need for FOBT (stool) testing for at least 5 years.  eSigned:  Jerene Bears, MD 09/08/2015 9:31 AM   cc:  the patient, Dr. Melford Aase

## 2015-09-08 NOTE — Progress Notes (Signed)
Patient awakening,vss,report to rn 

## 2015-09-08 NOTE — Patient Instructions (Signed)
YOU HAD AN ENDOSCOPIC PROCEDURE TODAY AT THE [facility]:   Refer to the procedure report that was given to you for any specific questions about what was found during the examination.  If the procedure report does not answer your questions, please call your gastroenterologist to clarify.  If you requested that your care partner not be given the details of your procedure findings, then the procedure report has been included in a sealed envelope for you to review at your convenience later.  YOU SHOULD EXPECT: Some feelings of bloating in the abdomen. Passage of more gas than usual.  Walking can help get rid of the air that was put into your GI tract during the procedure and reduce the bloating. If you had a lower endoscopy (such as a colonoscopy or flexible sigmoidoscopy) you may notice spotting of blood in your stool or on the toilet paper. If you underwent a bowel prep for your procedure, you may not have a normal bowel movement for a few days.  Please Note:  You might notice some irritation and congestion in your nose or some drainage.  This is from the oxygen used during your procedure.  There is no need for concern and it should clear up in a day or so.  SYMPTOMS TO REPORT IMMEDIATELY:   Following lower endoscopy (colonoscopy or flexible sigmoidoscopy):  Excessive amounts of blood in the stool  Significant tenderness or worsening of abdominal pains  Swelling of the abdomen that is new, acute  Fever of 100F or higher   For urgent or emergent issues, a gastroenterologist can be reached at any hour by calling [phone].   DIET: Your first meal following the procedure should be a small meal and then it is ok to progress to your normal diet. Heavy or fried foods are harder to digest and may make you feel nauseous or bloated.  Likewise, meals heavy in dairy and vegetables can increase bloating.  Drink plenty of fluids but you should avoid alcoholic beverages for 24  hours.  ACTIVITY:  You should plan to take it easy for the rest of today and you should NOT DRIVE or use heavy machinery until tomorrow (because of the sedation medicines used during the test).    FOLLOW UP: Our staff will call the number listed on your records the next business day following your procedure to check on you and address any questions or concerns that you may have regarding the information given to you following your procedure. If we do not reach you, we will leave a message.  However, if you are feeling well and you are not experiencing any problems, there is no need to return our call.  We will assume that you have returned to your regular daily activities without incident.  If any biopsies were taken you will be contacted by phone or by letter within the next 1-3 weeks.  Please call us at [phone] if you have not heard about the biopsies in 3 weeks.    SIGNATURES/CONFIDENTIALITY: You and/or your care partner have signed paperwork which will be entered into your electronic medical record.  These signatures attest to the fact that that the information above on your After Visit Summary has been reviewed and is understood.  Full responsibility of the confidentiality of this discharge information lies with you and/or your care-partner. 

## 2015-09-08 NOTE — Telephone Encounter (Signed)
Patient picked up the suprep

## 2015-09-09 ENCOUNTER — Telehealth: Payer: Self-pay | Admitting: *Deleted

## 2015-09-09 NOTE — Telephone Encounter (Signed)
Left message on f/u call 

## 2015-09-21 DIAGNOSIS — M810 Age-related osteoporosis without current pathological fracture: Secondary | ICD-10-CM

## 2015-09-21 HISTORY — DX: Age-related osteoporosis without current pathological fracture: M81.0

## 2015-09-22 ENCOUNTER — Ambulatory Visit (INDEPENDENT_AMBULATORY_CARE_PROVIDER_SITE_OTHER): Payer: BLUE CROSS/BLUE SHIELD | Admitting: Gynecology

## 2015-09-22 ENCOUNTER — Encounter: Payer: Self-pay | Admitting: Gynecology

## 2015-09-22 VITALS — BP 120/76 | Ht 64.5 in | Wt 121.0 lb

## 2015-09-22 DIAGNOSIS — N898 Other specified noninflammatory disorders of vagina: Secondary | ICD-10-CM

## 2015-09-22 DIAGNOSIS — R3915 Urgency of urination: Secondary | ICD-10-CM

## 2015-09-22 DIAGNOSIS — M810 Age-related osteoporosis without current pathological fracture: Secondary | ICD-10-CM | POA: Diagnosis not present

## 2015-09-22 DIAGNOSIS — Z01419 Encounter for gynecological examination (general) (routine) without abnormal findings: Secondary | ICD-10-CM | POA: Diagnosis not present

## 2015-09-22 LAB — WET PREP FOR TRICH, YEAST, CLUE
Clue Cells Wet Prep HPF POC: NONE SEEN
Trich, Wet Prep: NONE SEEN
WBC, Wet Prep HPF POC: NONE SEEN
YEAST WET PREP: NONE SEEN

## 2015-09-22 MED ORDER — ALENDRONATE SODIUM 70 MG PO TABS: ORAL_TABLET | ORAL | Status: DC

## 2015-09-22 NOTE — Patient Instructions (Signed)
Follow up for bone density as scheduled  You may obtain a copy of any labs that were done today by logging onto MyChart as outlined in the instructions provided with your AVS (after visit summary). The office will not call with normal lab results but certainly if there are any significant abnormalities then we will contact you.   Health Maintenance Adopting a healthy lifestyle and getting preventive care can go a long way to promote health and wellness. Talk with your health care provider about what schedule of regular examinations is right for you. This is a good chance for you to check in with your provider about disease prevention and staying healthy. In between checkups, there are plenty of things you can do on your own. Experts have done a lot of research about which lifestyle changes and preventive measures are most likely to keep you healthy. Ask your health care provider for more information. WEIGHT AND DIET  Eat a healthy diet  Be sure to include plenty of vegetables, fruits, low-fat dairy products, and lean protein.  Do not eat a lot of foods high in solid fats, added sugars, or salt.  Get regular exercise. This is one of the most important things you can do for your health.  Most adults should exercise for at least 150 minutes each week. The exercise should increase your heart rate and make you sweat (moderate-intensity exercise).  Most adults should also do strengthening exercises at least twice a week. This is in addition to the moderate-intensity exercise.  Maintain a healthy weight  Body mass index (BMI) is a measurement that can be used to identify possible weight problems. It estimates body fat based on height and weight. Your health care provider can help determine your BMI and help you achieve or maintain a healthy weight.  For females 17 years of age and older:   A BMI below 18.5 is considered underweight.  A BMI of 18.5 to 24.9 is normal.  A BMI of 25 to 29.9 is  considered overweight.  A BMI of 30 and above is considered obese.  Watch levels of cholesterol and blood lipids  You should start having your blood tested for lipids and cholesterol at 53 years of age, then have this test every 5 years.  You may need to have your cholesterol levels checked more often if:  Your lipid or cholesterol levels are high.  You are older than 53 years of age.  You are at high risk for heart disease.  CANCER SCREENING   Lung Cancer  Lung cancer screening is recommended for adults 74-19 years old who are at high risk for lung cancer because of a history of smoking.  A yearly low-dose CT scan of the lungs is recommended for people who:  Currently smoke.  Have quit within the past 15 years.  Have at least a 30-pack-year history of smoking. A pack year is smoking an average of one pack of cigarettes a day for 1 year.  Yearly screening should continue until it has been 15 years since you quit.  Yearly screening should stop if you develop a health problem that would prevent you from having lung cancer treatment.  Breast Cancer  Practice breast self-awareness. This means understanding how your breasts normally appear and feel.  It also means doing regular breast self-exams. Let your health care provider know about any changes, no matter how small.  If you are in your 20s or 30s, you should have a clinical breast exam (  CBE) by a health care provider every 1-3 years as part of a regular health exam.  If you are 56 or older, have a CBE every year. Also consider having a breast X-ray (mammogram) every year.  If you have a family history of breast cancer, talk to your health care provider about genetic screening.  If you are at high risk for breast cancer, talk to your health care provider about having an MRI and a mammogram every year.  Breast cancer gene (BRCA) assessment is recommended for women who have family members with BRCA-related cancers.  BRCA-related cancers include:  Breast.  Ovarian.  Tubal.  Peritoneal cancers.  Results of the assessment will determine the need for genetic counseling and BRCA1 and BRCA2 testing. Cervical Cancer Routine pelvic examinations to screen for cervical cancer are no longer recommended for nonpregnant women who are considered low risk for cancer of the pelvic organs (ovaries, uterus, and vagina) and who do not have symptoms. A pelvic examination may be necessary if you have symptoms including those associated with pelvic infections. Ask your health care provider if a screening pelvic exam is right for you.   The Pap test is the screening test for cervical cancer for women who are considered at risk.  If you had a hysterectomy for a problem that was not cancer or a condition that could lead to cancer, then you no longer need Pap tests.  If you are older than 65 years, and you have had normal Pap tests for the past 10 years, you no longer need to have Pap tests.  If you have had past treatment for cervical cancer or a condition that could lead to cancer, you need Pap tests and screening for cancer for at least 20 years after your treatment.  If you no longer get a Pap test, assess your risk factors if they change (such as having a new sexual partner). This can affect whether you should start being screened again.  Some women have medical problems that increase their chance of getting cervical cancer. If this is the case for you, your health care provider may recommend more frequent screening and Pap tests.  The human papillomavirus (HPV) test is another test that may be used for cervical cancer screening. The HPV test looks for the virus that can cause cell changes in the cervix. The cells collected during the Pap test can be tested for HPV.  The HPV test can be used to screen women 39 years of age and older. Getting tested for HPV can extend the interval between normal Pap tests from three to  five years.  An HPV test also should be used to screen women of any age who have unclear Pap test results.  After 53 years of age, women should have HPV testing as often as Pap tests.  Colorectal Cancer  This type of cancer can be detected and often prevented.  Routine colorectal cancer screening usually begins at 53 years of age and continues through 53 years of age.  Your health care provider may recommend screening at an earlier age if you have risk factors for colon cancer.  Your health care provider may also recommend using home test kits to check for hidden blood in the stool.  A small camera at the end of a tube can be used to examine your colon directly (sigmoidoscopy or colonoscopy). This is done to check for the earliest forms of colorectal cancer.  Routine screening usually begins at age 77.  Direct examination of the colon should be repeated every 5-10 years through 53 years of age. However, you may need to be screened more often if early forms of precancerous polyps or small growths are found. Skin Cancer  Check your skin from head to toe regularly.  Tell your health care provider about any new moles or changes in moles, especially if there is a change in a mole's shape or color.  Also tell your health care provider if you have a mole that is larger than the size of a pencil eraser.  Always use sunscreen. Apply sunscreen liberally and repeatedly throughout the day.  Protect yourself by wearing long sleeves, pants, a wide-brimmed hat, and sunglasses whenever you are outside. HEART DISEASE, DIABETES, AND HIGH BLOOD PRESSURE   Have your blood pressure checked at least every 1-2 years. High blood pressure causes heart disease and increases the risk of stroke.  If you are between 76 years and 55 years old, ask your health care provider if you should take aspirin to prevent strokes.  Have regular diabetes screenings. This involves taking a blood sample to check your  fasting blood sugar level.  If you are at a normal weight and have a low risk for diabetes, have this test once every three years after 53 years of age.  If you are overweight and have a high risk for diabetes, consider being tested at a younger age or more often. PREVENTING INFECTION  Hepatitis B  If you have a higher risk for hepatitis B, you should be screened for this virus. You are considered at high risk for hepatitis B if:  You were born in a country where hepatitis B is common. Ask your health care provider which countries are considered high risk.  Your parents were born in a high-risk country, and you have not been immunized against hepatitis B (hepatitis B vaccine).  You have HIV or AIDS.  You use needles to inject street drugs.  You live with someone who has hepatitis B.  You have had sex with someone who has hepatitis B.  You get hemodialysis treatment.  You take certain medicines for conditions, including cancer, organ transplantation, and autoimmune conditions. Hepatitis C  Blood testing is recommended for:  Everyone born from [birth year] through 1963.  Anyone with known risk factors for hepatitis C. Sexually transmitted infections (STIs)  You should be screened for sexually transmitted infections (STIs) including gonorrhea and chlamydia if:  You are sexually active and are younger than 53 years of age.  You are older than 53 years of age and your health care provider tells you that you are at risk for this type of infection.  Your sexual activity has changed since you were last screened and you are at an increased risk for chlamydia or gonorrhea. Ask your health care provider if you are at risk.  If you do not have HIV, but are at risk, it may be recommended that you take a prescription medicine daily to prevent HIV infection. This is called pre-exposure prophylaxis (PrEP). You are considered at risk if:  You are sexually active and do not regularly use condoms or  know the HIV status of your partner(s).  You take drugs by injection.  You are sexually active with a partner who has HIV. Talk with your health care provider about whether you are at high risk of being infected with HIV. If you choose to begin PrEP, you should first be tested for HIV. You should then be  tested every 3 months for as long as you are taking PrEP.  PREGNANCY   If you are premenopausal and you may become pregnant, ask your health care provider about preconception counseling.  If you may become pregnant, take 400 to 800 micrograms (mcg) of folic acid every day.  If you want to prevent pregnancy, talk to your health care provider about birth control (contraception). OSTEOPOROSIS AND MENOPAUSE   Osteoporosis is a disease in which the bones lose minerals and strength with aging. This can result in serious bone fractures. Your risk for osteoporosis can be identified using a bone density scan.  If you are 25 years of age or older, or if you are at risk for osteoporosis and fractures, ask your health care provider if you should be screened.  Ask your health care provider whether you should take a calcium or vitamin D supplement to lower your risk for osteoporosis.  Menopause may have certain physical symptoms and risks.  Hormone replacement therapy may reduce some of these symptoms and risks. Talk to your health care provider about whether hormone replacement therapy is right for you.  HOME CARE INSTRUCTIONS   Schedule regular health, dental, and eye exams.  Stay current with your immunizations.   Do not use any tobacco products including cigarettes, chewing tobacco, or electronic cigarettes.  If you are pregnant, do not drink alcohol.  If you are breastfeeding, limit how much and how often you drink alcohol.  Limit alcohol intake to no more than 1 drink per day for nonpregnant women. One drink equals 12 ounces of beer, 5 ounces of wine, or 1 ounces of hard liquor.  Do  not use street drugs.  Do not share needles.  Ask your health care provider for help if you need support or information about quitting drugs.  Tell your health care provider if you often feel depressed.  Tell your health care provider if you have ever been abused or do not feel safe at home. Document Released: 02/19/2011 Document Revised: 12/21/2013 Document Reviewed: 07/08/2013 [facility] Patient Information 2015 [place], [place]. This information is not intended to replace advice given to you by your health care provider. Make sure you discuss any questions you have with your health care provider.

## 2015-09-22 NOTE — Progress Notes (Signed)
Autumn Morrison 01-15-1961 [id number]        55 y.o.  G2P2002  for annual exam.  Overall doing well. Several issues noted below.  Past medical history,surgical history, problem list, medications, allergies, family history and social history were all reviewed and documented as reviewed in the EPIC chart.  ROS:  Performed with pertinent positives and negatives included in the history, assessment and plan.   Additional significant findings :  none   Exam: [name] assistant Filed Vitals:   09/22/15 0955  BP: 120/76  Height: 5' 4.5" (1.638 m)  Weight: 121 lb (54.885 kg)   General appearance:  Normal affect, orientation and appearance. Skin: Grossly normal HEENT: Without gross lesions.  No cervical or supraclavicular adenopathy. Thyroid normal.  Lungs:  Clear without wheezing, rales or rhonchi Cardiac: RR, without RMG Abdominal:  Soft, nontender, without masses, guarding, rebound, organomegaly or hernia Breasts:  Examined lying and sitting without masses, retractions, discharge or axillary adenopathy. Pelvic:  Ext/BUS/vagina with atrophic changes. Mild cystocele noted  Cervix with atrophic changes  Uterus anteverted, normal size, shape and contour, midline and mobile nontender   Adnexa without masses or tenderness    Anus and perineum normal   Rectovaginal normal sphincter tone without palpated masses or tenderness.    Assessment/Plan:  55 y.o. G48P2002 female for annual exam.   1. Postmenopausal/atrophic genital changes. Doing well without significant hot flushes, night sweats, vaginal dryness or any vaginal bleeding. Continue to monitor and report any vaginal bleeding. 2. Vaginal wetness.  Patient notes some vaginal dampness. No real discharge or odor. No irritation or itching. Wet prep is negative and exam was negative. I suspect this is normal/possible slight urinary leakage. Patient's comfortable with observation at this point. 3. Urinary urgency. Patient notes particularly  with bladder full she has some urgency when going to the bathroom. Occasional incontinence episodes. Check urinalysis. Reviewed options to include behavior modification with more frequent voiding and avoiding bladder stimulants. Options for OAB medications reviewed but at this point she does not feel warrants it. Will follow up if symptoms worsen or she wants to rediscuss medication. 4. Osteoporosis. DEXA 2014 T score -2.5. Started Fosamax 2 years ago. Had a short break for several months when she ran out but overall has been taking it consistently. Check DEXA now a 2 year interval and she will schedule. Discussed tentative five-year course and then drug-free holiday. Prior workup for secondary causes negative. 5. Mammography 01/2015. Continue with annual mammography when due. SBE month are reviewed. 6. Pap smear/HPV normal 2016. No Pap smear done today. No history of significant abnormal Pap smears. 7. Colonoscopy 2017. Repeat at their recommended interval. 8. Health maintenance. No routine lab work done as patient does this at her primary physician's office. Follow up for bone density otherwise 1 year, sooner as needed.   [name] MD, 10:15 AM 09/22/2015

## 2015-09-23 LAB — URINALYSIS W MICROSCOPIC + REFLEX CULTURE
BACTERIA UA: NONE SEEN [HPF]
Bilirubin Urine: NEGATIVE
CRYSTALS: NONE SEEN [HPF]
Casts: NONE SEEN [LPF]
GLUCOSE, UA: NEGATIVE
HGB URINE DIPSTICK: NEGATIVE
KETONES UR: NEGATIVE
Leukocytes, UA: NEGATIVE
Nitrite: NEGATIVE
PROTEIN: NEGATIVE
RBC / HPF: NONE SEEN RBC/HPF (ref <=2)
Specific Gravity, Urine: 1.008 (ref 1.001–1.035)
Squamous Epithelial / LPF: NONE SEEN [HPF] (ref <=5)
WBC, UA: NONE SEEN WBC/HPF (ref <=5)
Yeast: NONE SEEN [HPF]
pH: 7 (ref 5.0–8.0)

## 2015-10-11 ENCOUNTER — Ambulatory Visit (INDEPENDENT_AMBULATORY_CARE_PROVIDER_SITE_OTHER): Payer: BLUE CROSS/BLUE SHIELD

## 2015-10-11 DIAGNOSIS — M810 Age-related osteoporosis without current pathological fracture: Secondary | ICD-10-CM

## 2015-10-12 ENCOUNTER — Telehealth: Payer: Self-pay | Admitting: Gynecology

## 2015-10-12 ENCOUNTER — Encounter: Payer: Self-pay | Admitting: Gynecology

## 2015-10-12 NOTE — Telephone Encounter (Signed)
Tell patient her bone density is improved consistent with her Fosamax use. Continue on the Fosamax and we will plan on repeating bone density in 2 years

## 2015-10-12 NOTE — Telephone Encounter (Signed)
Per DPR access the below was left on voicemail.

## 2015-11-08 ENCOUNTER — Other Ambulatory Visit: Payer: Self-pay | Admitting: Gynecology

## 2016-01-11 ENCOUNTER — Other Ambulatory Visit: Payer: Self-pay

## 2016-01-11 DIAGNOSIS — Z1231 Encounter for screening mammogram for malignant neoplasm of breast: Secondary | ICD-10-CM

## 2016-02-02 ENCOUNTER — Ambulatory Visit
Admission: RE | Admit: 2016-02-02 | Discharge: 2016-02-02 | Disposition: A | Discharge status: Home / Self Care | Payer: BLUE CROSS/BLUE SHIELD | Source: Ambulatory Visit

## 2016-02-02 DIAGNOSIS — Z1231 Encounter for screening mammogram for malignant neoplasm of breast: Secondary | ICD-10-CM

## 2016-11-04 ENCOUNTER — Other Ambulatory Visit: Payer: Self-pay | Admitting: Gynecology

## 2016-11-27 ENCOUNTER — Ambulatory Visit (INDEPENDENT_AMBULATORY_CARE_PROVIDER_SITE_OTHER): Payer: BLUE CROSS/BLUE SHIELD | Admitting: Gynecology

## 2016-11-27 ENCOUNTER — Encounter: Payer: Self-pay | Admitting: Gynecology

## 2016-11-27 VITALS — BP 122/78 | Ht 64.0 in | Wt 120.0 lb

## 2016-11-27 DIAGNOSIS — M810 Age-related osteoporosis without current pathological fracture: Secondary | ICD-10-CM | POA: Diagnosis not present

## 2016-11-27 DIAGNOSIS — Z01411 Encounter for gynecological examination (general) (routine) with abnormal findings: Secondary | ICD-10-CM | POA: Diagnosis not present

## 2016-11-27 DIAGNOSIS — N952 Postmenopausal atrophic vaginitis: Secondary | ICD-10-CM

## 2016-11-27 MED ORDER — ALENDRONATE SODIUM 70 MG PO TABS: ORAL_TABLET | ORAL | 4 refills | Status: DC

## 2016-11-27 NOTE — Progress Notes (Signed)
    Autumn Morrison 1961/01/21 [id number]        56 y.o.  G2P2002 for annual exam.    Past medical history,surgical history, problem list, medications, allergies, family history and social history were all reviewed and documented as reviewed in the EPIC chart.  ROS:  Performed with pertinent positives and negatives included in the history, assessment and plan.   Additional significant findings :  None   Exam: [name] assistant Vitals:   11/27/16 1004  BP: 122/78  Weight: 120 lb (54.4 kg)  Height: 5\' 4"  (1.626 m)   Body mass index is 20.6 kg/m.  General appearance:  Normal affect, orientation and appearance. Skin: Grossly normal HEENT: Without gross lesions.  No cervical or supraclavicular adenopathy. Thyroid normal.  Lungs:  Clear without wheezing, rales or rhonchi Cardiac: RR, without RMG Abdominal:  Soft, nontender, without masses, guarding, rebound, organomegaly or hernia Breasts:  Examined lying and sitting without masses, retractions, discharge or axillary adenopathy. Pelvic:  Ext, BUS, Vagina: Normal with mild atrophic changes  Cervix: Normal with atrophic changes  Uterus: Anteverted, normal size, shape and contour, midline and mobile nontender   Adnexa: Without masses or tenderness    Anus and perineum: Normal   Rectovaginal: Normal sphincter tone without palpated masses or tenderness.    Assessment/Plan:  56 y.o. G55P2002 female for annual exam.   1. Postmenopausal/atrophic genital changes. No significant hot flushes, night sweats, vaginal dryness or any vaginal bleeding. Continue monitor report any issues. 2. Osteoporosis. DEXA 09/2015 T score -2.4, improved at all measured sites from prior DEXA 2014. On Fosamax for 3 years. Doing well with this. Refill 1 year provided. Plant follow up DEXA next year. 3. Mammography coming due in June and I reminded her to schedule this. SBE monthly reviewed. 4. Pap smear/HPV 2016 normal. No Pap smear done today. No history of  significant abnormal Pap smears. Plan repeat Pap smear approaching 5 year interval per current screening guidelines. 5. Colonoscopy 2017. Repeat at their recommended interval. 6. Health maintenance. No routine lab work done as patient does this elsewhere. Follow up 1 year, sooner as needed.   [name] MD, 10:37 AM 11/27/2016

## 2016-11-27 NOTE — Patient Instructions (Signed)

## 2017-02-08 ENCOUNTER — Other Ambulatory Visit: Payer: Self-pay | Admitting: Gynecology

## 2017-02-08 DIAGNOSIS — Z1231 Encounter for screening mammogram for malignant neoplasm of breast: Secondary | ICD-10-CM

## 2017-02-18 ENCOUNTER — Ambulatory Visit: Payer: BLUE CROSS/BLUE SHIELD

## 2017-02-26 ENCOUNTER — Ambulatory Visit: Payer: BLUE CROSS/BLUE SHIELD

## 2017-03-12 ENCOUNTER — Ambulatory Visit
Admission: RE | Admit: 2017-03-12 | Discharge: 2017-03-12 | Disposition: A | Discharge status: Home / Self Care | Payer: BLUE CROSS/BLUE SHIELD | Source: Ambulatory Visit | Attending: Gynecology | Admitting: Gynecology

## 2017-03-12 DIAGNOSIS — Z1231 Encounter for screening mammogram for malignant neoplasm of breast: Secondary | ICD-10-CM

## 2017-12-08 ENCOUNTER — Other Ambulatory Visit: Payer: Self-pay | Admitting: Gynecology

## 2017-12-09 NOTE — Telephone Encounter (Signed)
annual exam scheduled on 01/16/18

## 2017-12-16 ENCOUNTER — Encounter: Payer: BLUE CROSS/BLUE SHIELD | Admitting: Gynecology

## 2018-01-16 ENCOUNTER — Encounter: Payer: BLUE CROSS/BLUE SHIELD | Admitting: Gynecology

## 2018-03-03 ENCOUNTER — Other Ambulatory Visit: Payer: Self-pay | Admitting: Gynecology

## 2018-03-06 ENCOUNTER — Other Ambulatory Visit: Payer: Self-pay | Admitting: Gynecology

## 2018-03-06 ENCOUNTER — Encounter: Payer: Self-pay | Admitting: Gynecology

## 2018-03-06 ENCOUNTER — Ambulatory Visit: Payer: BLUE CROSS/BLUE SHIELD | Admitting: Gynecology

## 2018-03-06 VITALS — BP 144/84 | Ht 64.0 in | Wt 120.0 lb

## 2018-03-06 DIAGNOSIS — M810 Age-related osteoporosis without current pathological fracture: Secondary | ICD-10-CM | POA: Diagnosis not present

## 2018-03-06 DIAGNOSIS — N952 Postmenopausal atrophic vaginitis: Secondary | ICD-10-CM | POA: Diagnosis not present

## 2018-03-06 DIAGNOSIS — Z01419 Encounter for gynecological examination (general) (routine) without abnormal findings: Secondary | ICD-10-CM

## 2018-03-06 DIAGNOSIS — Z1231 Encounter for screening mammogram for malignant neoplasm of breast: Secondary | ICD-10-CM

## 2018-03-06 MED ORDER — ALENDRONATE SODIUM 70 MG PO TABS: ORAL_TABLET | ORAL | 4 refills | Status: DC

## 2018-03-06 NOTE — Patient Instructions (Signed)
Followup for bone density as scheduled. 

## 2018-03-06 NOTE — Progress Notes (Signed)
    Autumn Morrison [birth year] [id number]        57 y.o.  [id number] for annual gynecologic exam.  Doing well without gynecologic complaints  Past medical history,surgical history, problem list, medications, allergies, family history and social history were all reviewed and documented as reviewed in the EPIC chart.  ROS:  Performed with pertinent positives and negatives included in the history, assessment and plan.   Additional significant findings : None   Exam: [name] assistant Vitals:   03/06/18 0844  BP: (!) 144/84  Weight: 120 lb (54.4 kg)  Height: 5\' 4"  (1.626 m)   Body mass index is 20.6 kg/m.  General appearance:  Normal affect, orientation and appearance. Skin: Grossly normal HEENT: Without gross lesions.  No cervical or supraclavicular adenopathy. Thyroid normal.  Lungs:  Clear without wheezing, rales or rhonchi Cardiac: RR, without RMG Abdominal:  Soft, nontender, without masses, guarding, rebound, organomegaly or hernia Breasts:  Examined lying and sitting without masses, retractions, discharge or axillary adenopathy. Pelvic:  Ext, BUS, Vagina: With atrophic changes  Cervix: With atrophic changes  Uterus: Anteverted, normal size, shape and contour, midline and mobile nontender   Adnexa: Without masses or tenderness    Anus and perineum: Normal   Rectovaginal: Normal sphincter tone without palpated masses or tenderness.    Assessment/Plan:  57 y.o. G47P2002 female for annual gynecologic exam.   1. Postmenopausal.  Without significant menopausal symptoms.  No vaginal bleeding.  Continue to monitor and report any issues or bleeding. 2. Osteoporosis.  DEXA 2017 T score -2.4.  On alendronate for 4 years.  Doing well without side effects.  Refill x1 year provided.  Schedule DEXA now at 2-year interval. 3. Mammography coming due and patient has scheduled.  Breast exam normal today. 4. Pap smear/HPV 2016.  No Pap smear done today.  No history of significant abnormal  Pap smears.  Plan repeat Pap smear/HPV at 5-year interval per current screening guidelines. 5. Colonoscopy 2017.  Repeat at their recommended interval. 6. Health maintenance.  Blood pressure noted at 144/84.  Patient notes it tends to be higher at doctor's visits.  She has an appointment to see her primary physician in August for routine checkup and will follow-up on at there.  No routine blood work done as patient plans to do this at her primary physician's appointment in August.  Follow-up for bone density.  Follow-up in 1 year for annual exam.   [name] MD, 9:13 AM 03/06/2018

## 2018-03-18 ENCOUNTER — Ambulatory Visit (INDEPENDENT_AMBULATORY_CARE_PROVIDER_SITE_OTHER): Payer: BLUE CROSS/BLUE SHIELD

## 2018-03-18 DIAGNOSIS — M810 Age-related osteoporosis without current pathological fracture: Secondary | ICD-10-CM | POA: Diagnosis not present

## 2018-03-19 ENCOUNTER — Encounter: Payer: Self-pay | Admitting: Gynecology

## 2018-03-20 ENCOUNTER — Encounter: Payer: Self-pay | Admitting: Anesthesiology

## 2018-04-08 ENCOUNTER — Ambulatory Visit
Admission: RE | Admit: 2018-04-08 | Discharge: 2018-04-08 | Disposition: A | Discharge status: Home / Self Care | Payer: BLUE CROSS/BLUE SHIELD | Source: Ambulatory Visit | Attending: Gynecology | Admitting: Gynecology

## 2018-04-08 DIAGNOSIS — Z1231 Encounter for screening mammogram for malignant neoplasm of breast: Secondary | ICD-10-CM

## 2018-11-21 IMAGING — MG DIGITAL SCREENING BILATERAL MAMMOGRAM WITH TOMO AND CAD
8 series · 9 of 24 positions shown · non-contrast
Comparison: Previous exam(s).

CLINICAL DATA: Screening.

EXAM:
DIGITAL SCREENING BILATERAL MAMMOGRAM WITH TOMO AND CAD

[L MLO synth-2D]
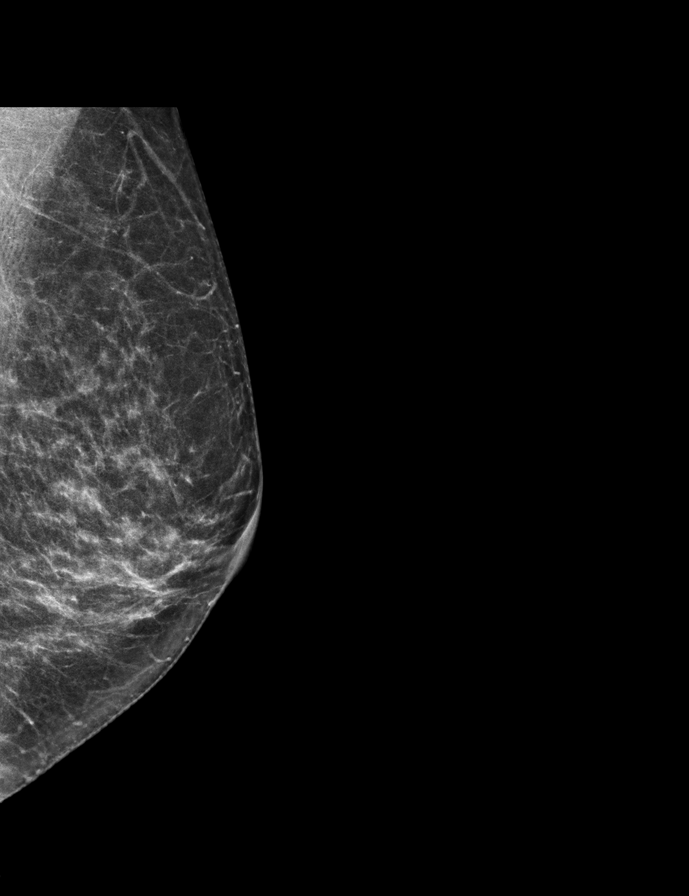

[L CC synth-2D]
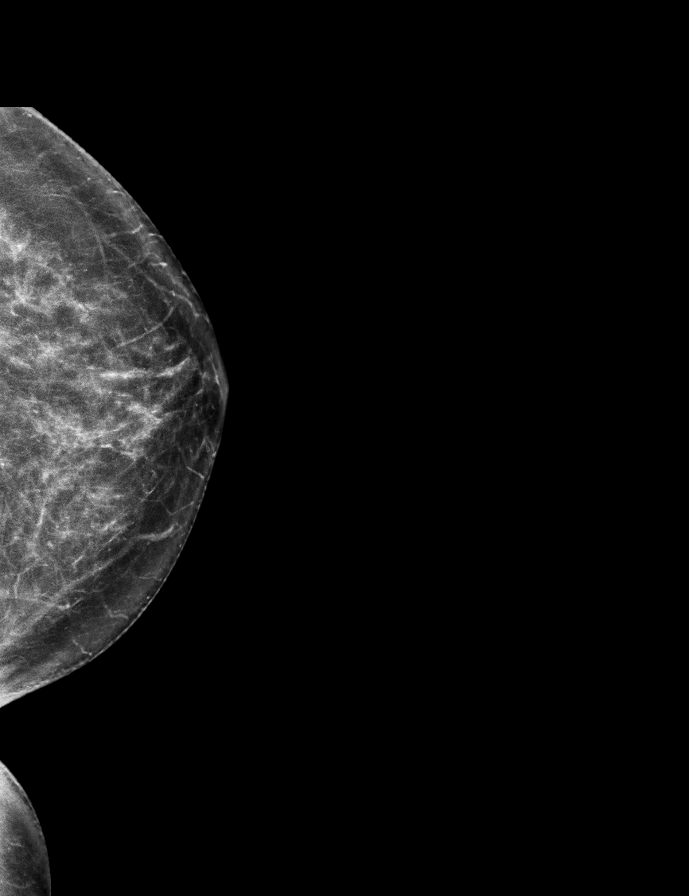

[R CC synth-2D]
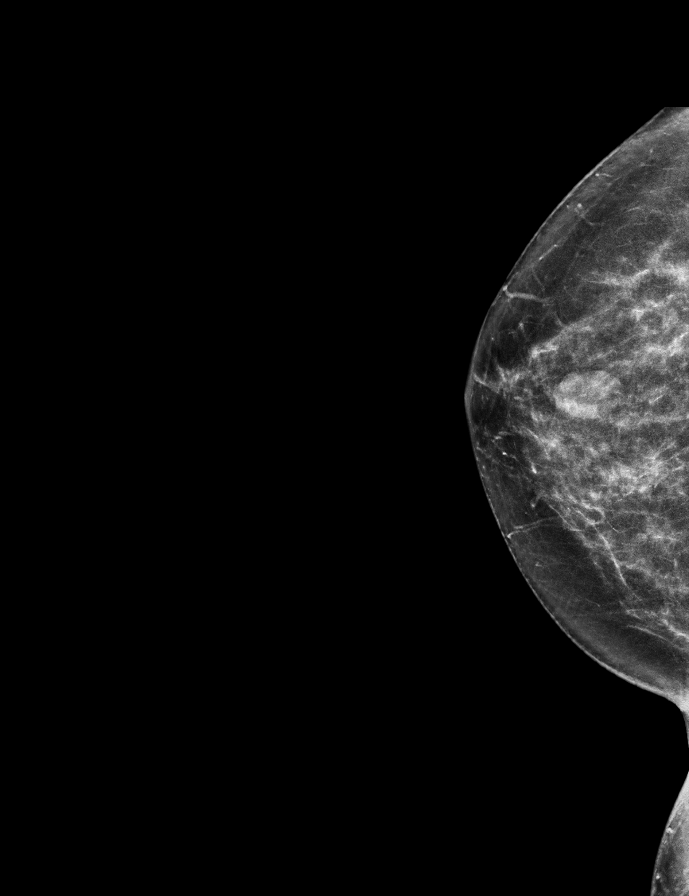

[R MLO synth-2D]
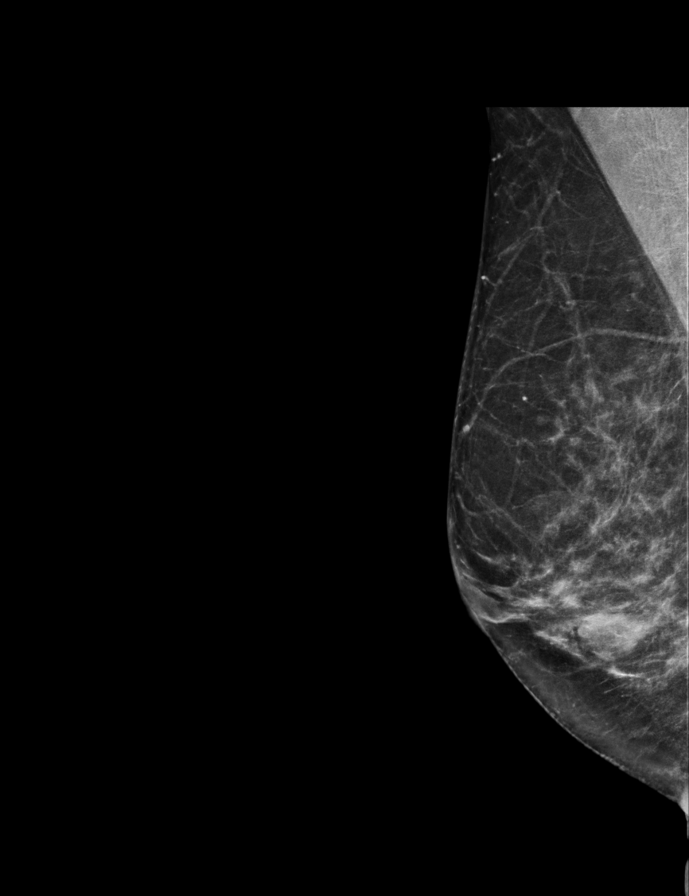

[L MLO tomo · 2 of 60 frames shown]
[frame 20/60]
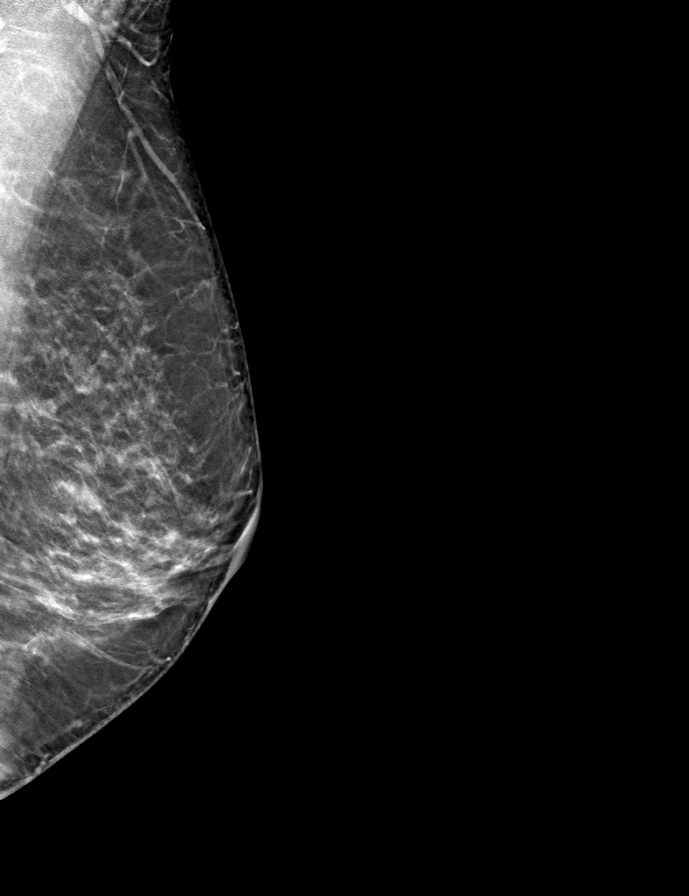
[frame 31/60]
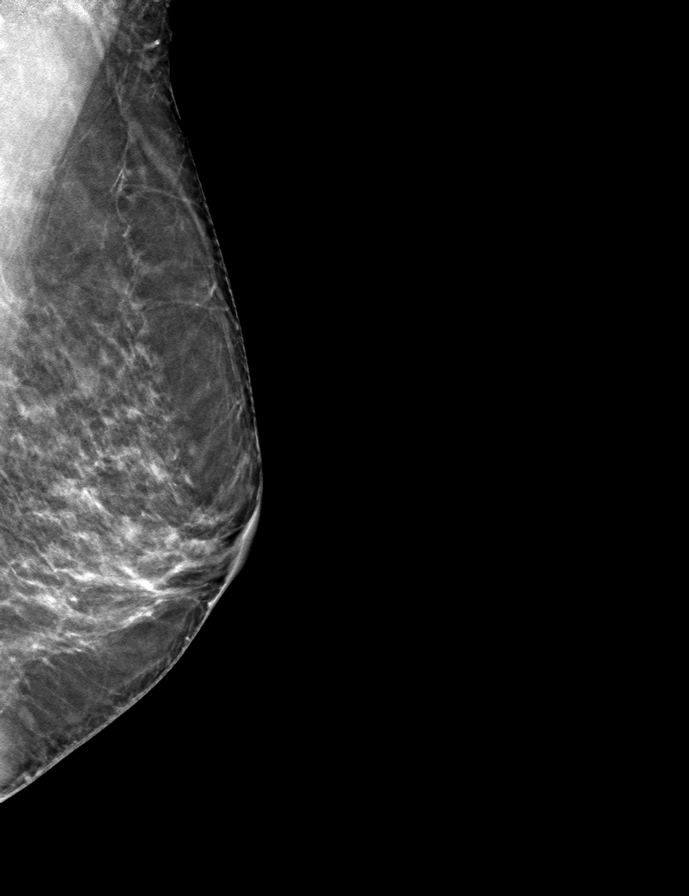

[R CC tomo · tomo slice 32/63.0]
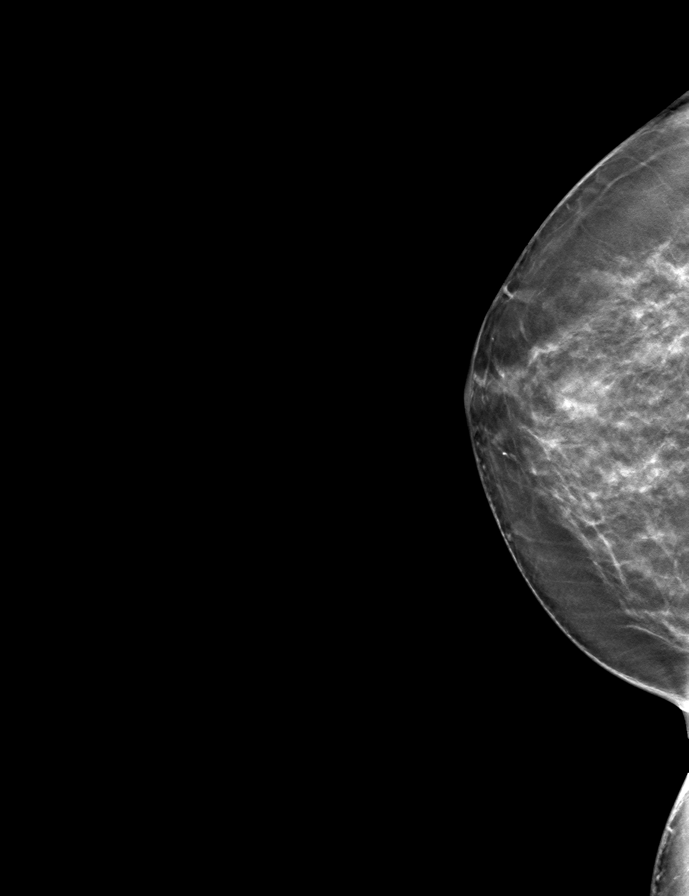

[L CC tomo · tomo slice 35/69.0]
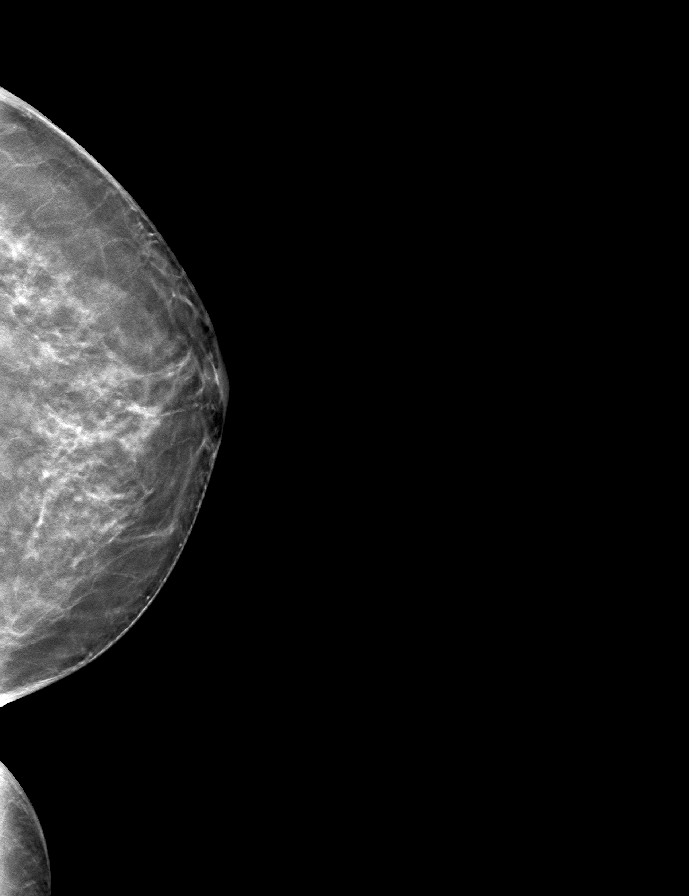

[R MLO tomo · tomo slice 33/65.0]
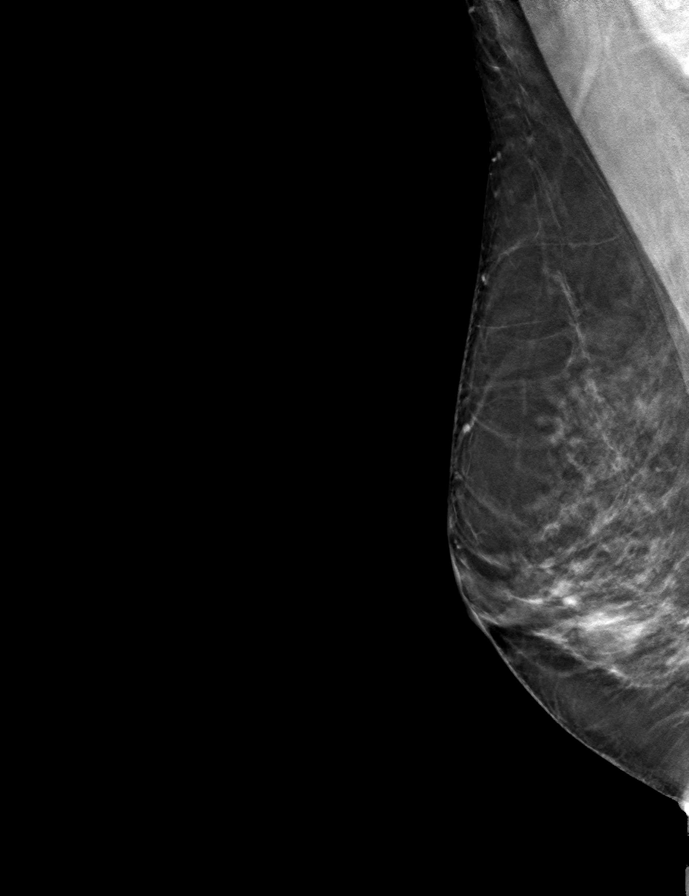

[9 of 24 positions shown; findings below may reference images not displayed]

ACR Breast Density Category c: The breast tissue is heterogeneously
dense, which may obscure small masses.
FINDINGS: There are no findings suspicious for malignancy. Images were
processed with CAD.
IMPRESSION: No mammographic evidence of malignancy. A result letter of this
screening mammogram will be mailed directly to the patient.

RECOMMENDATION:
Screening mammogram in one year. (Code:[id number]-U-LHB)

BI-RADS CATEGORY  1: Negative.

## 2019-02-16 ENCOUNTER — Other Ambulatory Visit: Payer: Self-pay | Admitting: Gynecology

## 2019-02-16 DIAGNOSIS — Z1231 Encounter for screening mammogram for malignant neoplasm of breast: Secondary | ICD-10-CM

## 2019-03-10 ENCOUNTER — Other Ambulatory Visit: Payer: Self-pay

## 2019-03-11 ENCOUNTER — Ambulatory Visit (INDEPENDENT_AMBULATORY_CARE_PROVIDER_SITE_OTHER): Payer: BC Managed Care – PPO | Admitting: Gynecology

## 2019-03-11 ENCOUNTER — Encounter: Payer: Self-pay | Admitting: Gynecology

## 2019-03-11 VITALS — BP 124/76 | Ht 64.0 in | Wt 117.0 lb

## 2019-03-11 DIAGNOSIS — M810 Age-related osteoporosis without current pathological fracture: Secondary | ICD-10-CM

## 2019-03-11 DIAGNOSIS — N952 Postmenopausal atrophic vaginitis: Secondary | ICD-10-CM | POA: Diagnosis not present

## 2019-03-11 DIAGNOSIS — Z1151 Encounter for screening for human papillomavirus (HPV): Secondary | ICD-10-CM | POA: Diagnosis not present

## 2019-03-11 DIAGNOSIS — Z01419 Encounter for gynecological examination (general) (routine) without abnormal findings: Secondary | ICD-10-CM | POA: Diagnosis not present

## 2019-03-11 NOTE — Progress Notes (Signed)
    Autumn Morrison [birth year] [id number]        58 y.o.  [id number] for annual gynecologic exam.  Without gynecologic complaints  Past medical history,surgical history, problem list, medications, allergies, family history and social history were all reviewed and documented as reviewed in the EPIC chart.  ROS:  Performed with pertinent positives and negatives included in the history, assessment and plan.   Additional significant findings : None   Exam: Autumn Morrison assistant Vitals:   03/11/19 0932  BP: 124/76  Weight: 117 lb (53.1 kg)  Height: 5\' 4"  (1.626 m)   Body mass index is 20.08 kg/m.  General appearance:  Normal affect, orientation and appearance. Skin: Grossly normal HEENT: Without gross lesions.  No cervical or supraclavicular adenopathy. Thyroid normal.  Lungs:  Clear without wheezing, rales or rhonchi Cardiac: RR, without RMG Abdominal:  Soft, nontender, without masses, guarding, rebound, organomegaly or hernia Breasts:  Examined lying and sitting without masses, retractions, discharge or axillary adenopathy. Pelvic:  Ext, BUS, Vagina: With atrophic changes  Cervix: With atrophic changes.  Pap smear/HPV  Uterus: Anteverted, normal size, shape and contour, midline and mobile nontender   Adnexa: Without masses or tenderness    Anus and perineum: Normal   Rectovaginal: Normal sphincter tone without palpated masses or tenderness.    Assessment/Plan:  58 y.o. G40P2002 female for annual gynecologic exam.   1. Postmenopausal.  No significant menopausal symptoms or any vaginal bleeding. 2. Postmenopausal osteoporosis.  DEXA 02/2018 T score -2.3.  T score 2014 -2.5.  On alendronate for approximately 5 years.  We will continue this coming year.  Repeat DEXA next year at 2-year interval.  Discussed possible drug-free holiday at that point.  Refill alendronate x1 year. 3. Pap smear/HPV 08/2014.  Pap smear/HPV today.  No history of abnormal Pap smears previously. 4. Mammography  coming due in August and she has scheduled.  Breast exam normal today. 5. Colonoscopy 2017.  Repeat at their recommended interval. 6. Health maintenance.  No routine lab work done as patient reports is done elsewhere.  Follow-up 1 year, sooner as needed.   [name] MD, 9:58 AM 03/11/2019

## 2019-03-11 NOTE — Patient Instructions (Signed)
Follow-up in 1 year for annual exam, sooner as needed. 

## 2019-03-11 NOTE — Addendum Note (Signed)
Addended by: Nelva Nay on: 03/11/2019 10:12 AM   Modules accepted: Orders

## 2019-03-12 LAB — PAP IG AND HPV HIGH-RISK: HPV DNA High Risk: NOT DETECTED

## 2019-04-03 ENCOUNTER — Other Ambulatory Visit: Payer: Self-pay | Admitting: Gynecology

## 2019-04-15 ENCOUNTER — Other Ambulatory Visit: Payer: Self-pay

## 2019-04-15 ENCOUNTER — Ambulatory Visit
Admission: RE | Admit: 2019-04-15 | Discharge: 2019-04-15 | Disposition: A | Discharge status: Home / Self Care | Payer: BLUE CROSS/BLUE SHIELD | Source: Ambulatory Visit | Attending: Gynecology | Admitting: Gynecology

## 2019-04-15 DIAGNOSIS — Z1231 Encounter for screening mammogram for malignant neoplasm of breast: Secondary | ICD-10-CM

## 2019-05-19 ENCOUNTER — Encounter: Payer: Self-pay | Admitting: Gynecology

## 2020-04-08 ENCOUNTER — Other Ambulatory Visit: Payer: Self-pay | Admitting: Family Medicine

## 2020-04-08 DIAGNOSIS — Z1231 Encounter for screening mammogram for malignant neoplasm of breast: Secondary | ICD-10-CM

## 2020-04-27 ENCOUNTER — Encounter: Payer: Self-pay | Admitting: Obstetrics and Gynecology

## 2020-04-27 ENCOUNTER — Other Ambulatory Visit: Payer: Self-pay

## 2020-04-27 ENCOUNTER — Ambulatory Visit (INDEPENDENT_AMBULATORY_CARE_PROVIDER_SITE_OTHER): Payer: 59 | Admitting: Obstetrics and Gynecology

## 2020-04-27 ENCOUNTER — Other Ambulatory Visit: Payer: Self-pay | Admitting: Obstetrics and Gynecology

## 2020-04-27 VITALS — BP 142/80 | Ht 64.0 in | Wt 111.0 lb

## 2020-04-27 DIAGNOSIS — Z01419 Encounter for gynecological examination (general) (routine) without abnormal findings: Secondary | ICD-10-CM | POA: Diagnosis not present

## 2020-04-27 DIAGNOSIS — M810 Age-related osteoporosis without current pathological fracture: Secondary | ICD-10-CM

## 2020-04-27 NOTE — Progress Notes (Signed)
   [place] Dec 18, 1960 [id number]  SUBJECTIVE:  59 y.o. G2P2002 female for annual routine gynecologic exam. She has no gynecologic concerns.  Current Outpatient Medications  Medication Sig Dispense Refill  . alendronate (FOSAMAX) 70 MG tablet TAKE WITH A FULL GLASS OF WATER ON AN EMPTY STOMACH. (ONCE WEEKLY OR AS DIRECTED) 12 tablet 4  . montelukast (SINGULAIR) 10 MG tablet Take 1 tablet by mouth daily.    . Multiple Vitamin (MULTIVITAMIN) tablet Take 1 tablet by mouth daily. Reported on 09/08/2015    . rosuvastatin (CRESTOR) 10 MG tablet Take 10 mg by mouth daily.     No current facility-administered medications for this visit.   Allergies: Patient has no known allergies.  Patient's last menstrual period was 03/21/2011.  Past medical history,surgical history, problem list, medications, allergies, family history and social history were all reviewed and documented as reviewed in the EPIC chart.  ROS:  Feeling well. No dyspnea or chest pain on exertion.  No abdominal pain, change in bowel habits, black or bloody stools.  No urinary tract symptoms. GYN ROS: no abnormal bleeding, pelvic pain or discharge, no breast pain or new or enlarging lumps on self exam.No neurological complaints.   OBJECTIVE:  BP (!) 142/80 (BP Location: Right Arm, Patient Position: Sitting, Cuff Size: Normal)   Ht 5\' 4"  (1.626 m)   Wt 111 lb (50.3 kg)   LMP 03/21/2011   BMI 19.05 kg/m   BP 140/80 on recheck The patient appears well, alert, oriented x 3, in no distress. ENT normal.  Neck supple. No cervical or supraclavicular adenopathy or thyromegaly.  Abdomen soft without tenderness, guarding, mass or organomegaly.  Neurological is normal, no focal findings.  BREAST EXAM: breasts appear normal, no suspicious masses, no skin or nipple changes or axillary nodes  PELVIC EXAM: VULVA: normal appearing vulva with atrophic change, no masses, tenderness or lesions, VAGINA: normal appearing vagina with atrophic  change, normal color and discharge, no lesions, CERVIX: normal appearing cervix with atrophic change, without discharge or lesions, UTERUS: uterus is normal size, shape, consistency and nontender, ADNEXA: normal adnexa in size, nontender and no masses  Chaperone: Aurora Mask (DNP student) present during the examination and performed the pelvic exam with me in attendance to confirm the exam findings   ASSESSMENT:  59 y.o. [id number] here for annual gynecologic exam  PLAN:   1. Postmenopausal. No significant hot flashes or night sweats. No vaginal bleeding. 2. Pap smear/HPV 02/2019.  No significant history of abnormal Pap smears.  Next Pap smear due 2025 following the current guidelines recommending the 5 year interval. 3. Mammogram scheduled 04/29/2020.  Normal breast exam today.  4. Colonoscopy 2017. She will follow up at the recommended interval.   5.  Osteoporosis.  DEXA 02/2018.  T score -2.3, has been on alendronate for approximately 6 years..  Next DEXA recommended now so she plans to schedule this.  If stable BMD will consider possible drug-free holiday. 6. Health maintenance.  No labs today as she normally has these completed elsewhere. BP slightly elevated on recheck today, communicated that she should periodically check and if elevated above 176H systolic and/or greater than 60V diastolic she should check in with her primary physician.  Return annually or sooner, prn.  Joseph Pierini MD 04/27/20

## 2020-04-29 ENCOUNTER — Ambulatory Visit
Admission: RE | Admit: 2020-04-29 | Discharge: 2020-04-29 | Disposition: A | Discharge status: Home / Self Care | Payer: 59 | Source: Ambulatory Visit | Attending: Family Medicine | Admitting: Family Medicine

## 2020-04-29 ENCOUNTER — Other Ambulatory Visit: Payer: Self-pay

## 2020-04-29 DIAGNOSIS — Z1231 Encounter for screening mammogram for malignant neoplasm of breast: Secondary | ICD-10-CM

## 2020-06-21 ENCOUNTER — Other Ambulatory Visit: Payer: Self-pay

## 2020-06-21 ENCOUNTER — Ambulatory Visit (INDEPENDENT_AMBULATORY_CARE_PROVIDER_SITE_OTHER): Payer: 59

## 2020-06-21 ENCOUNTER — Other Ambulatory Visit: Payer: Self-pay | Admitting: Obstetrics and Gynecology

## 2020-06-21 DIAGNOSIS — Z780 Asymptomatic menopausal state: Secondary | ICD-10-CM

## 2020-06-21 DIAGNOSIS — M810 Age-related osteoporosis without current pathological fracture: Secondary | ICD-10-CM | POA: Diagnosis not present

## 2020-10-17 ENCOUNTER — Other Ambulatory Visit: Payer: Self-pay

## 2020-10-17 ENCOUNTER — Encounter (HOSPITAL_COMMUNITY): Payer: Self-pay | Admitting: Orthopedic Surgery

## 2020-10-17 NOTE — Progress Notes (Signed)
PCP Melford Aase, MD Cardiologist - denies  Chest x-ray -  EKG - DOS Stress Test -  ECHO -  Cardiac Cath -   COVID TEST- DOS  Anesthesia review: n/a  -------------  SDW INSTRUCTIONS:  Your procedure is scheduled on 10/18/20. Please report to [facility]trance "A" at 12:00 P.M., and check in at the Admitting office. Call this number if you have problems the morning of surgery: [phone]   Remember: Do not eat or drink after midnight the night before your surgery  Medications to take morning of surgery with a sip of water include: cephALEXin (KEFLEX) montelukast (SINGULAIR) oxyCODONE (OXY IR/ROXICODONE)  rosuvastatin (CRESTOR)   As of today, STOP taking any Aspirin (unless otherwise instructed by your surgeon), Aleve, Naproxen, Ibuprofen, Motrin, Advil, Goody's, BC's, all herbal medications, fish oil, and all vitamins.    The Morning of Surgery Do not wear jewelry, make-up or nail polish. Do not wear lotions, powders, or perfumes, or deodorant Do not shave 48 hours prior to surgery.   Do not bring valuables to the hospital. [facility] is not responsible for any belongings or valuables. If you are a smoker, DO NOT Smoke 24 hours prior to surgery If you wear a CPAP at night please bring your mask the morning of surgery  Remember that you must have someone to transport you home after your surgery, and remain with you for 24 hours if you are discharged the same day. Please bring cases for contacts, glasses, hearing aids, dentures or bridgework because it cannot be worn into surgery.   Patients discharged the day of surgery will not be allowed to drive home.   Please shower the NIGHT BEFORE SURGERY and the MORNING OF SURGERY with DIAL Soap. Wear comfortable clothes the morning of surgery. Oral Hygiene is also important to reduce your risk of infection.  Remember - BRUSH YOUR TEETH THE MORNING OF SURGERY WITH YOUR REGULAR TOOTHPASTE  Patient denies shortness of breath,  fever, cough and chest pain.

## 2020-10-18 ENCOUNTER — Ambulatory Visit (HOSPITAL_COMMUNITY): Payer: 59 | Admitting: Anesthesiology

## 2020-10-18 ENCOUNTER — Encounter (HOSPITAL_COMMUNITY)
Admission: RE | Disposition: A | Discharge status: Home / Self Care | Payer: Self-pay | Source: Home / Self Care | Attending: Orthopedic Surgery

## 2020-10-18 ENCOUNTER — Ambulatory Visit (HOSPITAL_COMMUNITY): Payer: 59

## 2020-10-18 ENCOUNTER — Ambulatory Visit (HOSPITAL_COMMUNITY)
Admission: RE | Admit: 2020-10-18 | Discharge: 2020-10-18 | Disposition: A | Discharge status: Home / Self Care | Payer: 59 | Attending: Orthopedic Surgery | Admitting: Orthopedic Surgery

## 2020-10-18 ENCOUNTER — Encounter (HOSPITAL_COMMUNITY): Payer: Self-pay | Admitting: Orthopedic Surgery

## 2020-10-18 DIAGNOSIS — X58XXXA Exposure to other specified factors, initial encounter: Secondary | ICD-10-CM | POA: Insufficient documentation

## 2020-10-18 DIAGNOSIS — S52572A Other intraarticular fracture of lower end of left radius, initial encounter for closed fracture: Secondary | ICD-10-CM | POA: Insufficient documentation

## 2020-10-18 DIAGNOSIS — Z20822 Contact with and (suspected) exposure to covid-19: Secondary | ICD-10-CM | POA: Insufficient documentation

## 2020-10-18 DIAGNOSIS — Z79899 Other long term (current) drug therapy: Secondary | ICD-10-CM | POA: Insufficient documentation

## 2020-10-18 DIAGNOSIS — Z792 Long term (current) use of antibiotics: Secondary | ICD-10-CM | POA: Insufficient documentation

## 2020-10-18 DIAGNOSIS — Z7983 Long term (current) use of bisphosphonates: Secondary | ICD-10-CM | POA: Insufficient documentation

## 2020-10-18 HISTORY — DX: Unspecified osteoarthritis, unspecified site: M19.90

## 2020-10-18 HISTORY — PX: OPEN REDUCTION INTERNAL FIXATION (ORIF) DISTAL RADIAL FRACTURE: SHX5989

## 2020-10-18 LAB — BASIC METABOLIC PANEL
Anion gap: 10 (ref 5–15)
BUN: 11 mg/dL (ref 6–20)
CO2: 25 mmol/L (ref 22–32)
Calcium: 9 mg/dL (ref 8.9–10.3)
Chloride: 105 mmol/L (ref 98–111)
Creatinine, Ser: 0.56 mg/dL (ref 0.44–1.00)
GFR, Estimated: >60 mL/min (ref >60)
Glucose, Bld: 81 mg/dL (ref 70–99)
Potassium: 3.8 mmol/L (ref 3.5–5.1)
Sodium: 140 mmol/L (ref 135–145)

## 2020-10-18 LAB — SARS CORONAVIRUS 2 BY RT PCR (HOSPITAL ORDER, PERFORMED IN ~~LOC~~ HOSPITAL LAB): SARS Coronavirus 2: NEGATIVE

## 2020-10-18 SURGERY — OPEN REDUCTION INTERNAL FIXATION (ORIF) DISTAL RADIUS FRACTURE
Anesthesia: Monitor Anesthesia Care | Site: Wrist | Laterality: Left

## 2020-10-18 MED ORDER — MIDAZOLAM HCL 2 MG/2ML IJ SOLN
INTRAMUSCULAR | Status: AC
  Administered 2020-10-18: 2 mg via INTRAVENOUS
  Filled 2020-10-18: qty 2

## 2020-10-18 MED ORDER — EPHEDRINE SULFATE-NACL 50-0.9 MG/10ML-% IV SOSY
PREFILLED_SYRINGE | INTRAVENOUS | Status: DC | PRN
  Administered 2020-10-18: 10 mg via INTRAVENOUS

## 2020-10-18 MED ORDER — ORAL CARE MOUTH RINSE: 15.0000 mL | Freq: Once | OROMUCOSAL | Status: AC

## 2020-10-18 MED ORDER — LACTATED RINGERS IV SOLN: INTRAVENOUS | Status: DC

## 2020-10-18 MED ORDER — CEFAZOLIN SODIUM-DEXTROSE 2-4 GM/100ML-% IV SOLN
2.0000 g | INTRAVENOUS | Status: AC
  Administered 2020-10-18: 2 g via INTRAVENOUS
  Filled 2020-10-18: qty 100

## 2020-10-18 MED ORDER — DEXAMETHASONE SODIUM PHOSPHATE 10 MG/ML IJ SOLN
INTRAMUSCULAR | Status: DC | PRN
  Administered 2020-10-18: 10 mg

## 2020-10-18 MED ORDER — FENTANYL CITRATE (PF) 100 MCG/2ML IJ SOLN: 100.0000 ug | Freq: Once | INTRAMUSCULAR | Status: AC

## 2020-10-18 MED ORDER — BUPIVACAINE HCL (PF) 0.25 % IJ SOLN
INTRAMUSCULAR | Status: AC
  Filled 2020-10-18: qty 30

## 2020-10-18 MED ORDER — OXYCODONE HCL 5 MG/5ML PO SOLN
5.0000 mg | Freq: Once | ORAL | Status: DC | PRN
Start: 2020-10-18 — End: 2020-10-19

## 2020-10-18 MED ORDER — EPHEDRINE 5 MG/ML INJ
INTRAVENOUS | Status: AC
  Filled 2020-10-18: qty 10

## 2020-10-18 MED ORDER — IBUPROFEN 100 MG/5ML PO SUSP: 200.0000 mg | Freq: Four times a day (QID) | ORAL | Status: DC | PRN

## 2020-10-18 MED ORDER — MIDAZOLAM HCL 2 MG/2ML IJ SOLN
INTRAMUSCULAR | Status: AC
  Filled 2020-10-18: qty 2

## 2020-10-18 MED ORDER — FENTANYL CITRATE (PF) 100 MCG/2ML IJ SOLN
INTRAMUSCULAR | Status: AC
  Administered 2020-10-18: 100 ug via INTRAVENOUS
  Filled 2020-10-18: qty 2

## 2020-10-18 MED ORDER — PROPOFOL 500 MG/50ML IV EMUL
INTRAVENOUS | Status: DC | PRN
  Administered 2020-10-18: 125 ug/kg/min via INTRAVENOUS

## 2020-10-18 MED ORDER — 0.9 % SODIUM CHLORIDE (POUR BTL) OPTIME
TOPICAL | Status: DC | PRN
  Administered 2020-10-18: 1000 mL

## 2020-10-18 MED ORDER — FENTANYL CITRATE (PF) 250 MCG/5ML IJ SOLN
INTRAMUSCULAR | Status: AC
  Filled 2020-10-18: qty 5

## 2020-10-18 MED ORDER — KETOROLAC TROMETHAMINE 15 MG/ML IJ SOLN: 15.0000 mg | Freq: Once | INTRAMUSCULAR | Status: DC

## 2020-10-18 MED ORDER — CLONIDINE HCL (ANALGESIA) 100 MCG/ML EP SOLN
EPIDURAL | Status: DC | PRN
  Administered 2020-10-18: 100 ug

## 2020-10-18 MED ORDER — LIDOCAINE 2% (20 MG/ML) 5 ML SYRINGE
INTRAMUSCULAR | Status: AC
  Filled 2020-10-18: qty 5

## 2020-10-18 MED ORDER — MIDAZOLAM HCL 2 MG/2ML IJ SOLN: 2.0000 mg | Freq: Once | INTRAMUSCULAR | Status: AC

## 2020-10-18 MED ORDER — CHLORHEXIDINE GLUCONATE 0.12 % MT SOLN: 15.0000 mL | Freq: Once | OROMUCOSAL | Status: AC

## 2020-10-18 MED ORDER — OXYCODONE HCL 5 MG PO TABS: 5.0000 mg | ORAL_TABLET | Freq: Once | ORAL | Status: DC | PRN

## 2020-10-18 MED ORDER — FENTANYL CITRATE (PF) 100 MCG/2ML IJ SOLN
25.0000 ug | INTRAMUSCULAR | Status: DC | PRN
Start: 2020-10-18 — End: 2020-10-19

## 2020-10-18 MED ORDER — ACETAMINOPHEN 10 MG/ML IV SOLN: 1000.0000 mg | Freq: Once | INTRAVENOUS | Status: DC | PRN

## 2020-10-18 MED ORDER — MIDAZOLAM HCL 2 MG/2ML IJ SOLN
INTRAMUSCULAR | Status: DC | PRN
  Administered 2020-10-18: 2 mg via INTRAVENOUS

## 2020-10-18 MED ORDER — MEPERIDINE HCL 25 MG/ML IJ SOLN: 6.2500 mg | INTRAMUSCULAR | Status: DC | PRN

## 2020-10-18 MED ORDER — CHLORHEXIDINE GLUCONATE 0.12 % MT SOLN
OROMUCOSAL | Status: AC
  Administered 2020-10-18: 15 mL via OROMUCOSAL
  Filled 2020-10-18: qty 15

## 2020-10-18 MED ORDER — ROPIVACAINE HCL 7.5 MG/ML IJ SOLN
INTRAMUSCULAR | Status: DC | PRN
  Administered 2020-10-18 (×4): 5 mL via PERINEURAL

## 2020-10-18 MED ORDER — ONDANSETRON HCL 4 MG/2ML IJ SOLN: 4.0000 mg | Freq: Once | INTRAMUSCULAR | Status: DC | PRN

## 2020-10-18 MED ORDER — ONDANSETRON HCL 4 MG/2ML IJ SOLN
INTRAMUSCULAR | Status: AC
  Filled 2020-10-18: qty 2

## 2020-10-18 MED ORDER — IBUPROFEN 200 MG PO TABS: 200.0000 mg | ORAL_TABLET | Freq: Four times a day (QID) | ORAL | Status: DC | PRN

## 2020-10-18 SURGICAL SUPPLY — 64 items
BIT DRILL 2.2 SS TIBIAL (BIT) ×1 IMPLANT
BLADE CLIPPER SURG (BLADE) IMPLANT
BNDG CMPR 9X4 STRL LF SNTH (GAUZE/BANDAGES/DRESSINGS) ×1
BNDG ELASTIC 3X5.8 VLCR STR LF (GAUZE/BANDAGES/DRESSINGS) ×3 IMPLANT
BNDG ELASTIC 4X5.8 VLCR STR LF (GAUZE/BANDAGES/DRESSINGS) ×2 IMPLANT
BNDG ESMARK 4X9 LF (GAUZE/BANDAGES/DRESSINGS) ×2 IMPLANT
BNDG GAUZE ELAST 4 BULKY (GAUZE/BANDAGES/DRESSINGS) ×4 IMPLANT
CORD BIPOLAR FORCEPS 12FT (ELECTRODE) ×2 IMPLANT
COVER SURGICAL LIGHT HANDLE (MISCELLANEOUS) ×2 IMPLANT
COVER WAND RF STERILE (DRAPES) ×2 IMPLANT
CUFF TOURN SGL QUICK 18X4 (TOURNIQUET CUFF) ×2 IMPLANT
CUFF TOURN SGL QUICK 24 (TOURNIQUET CUFF)
CUFF TRNQT CYL 24X4X16.5-23 (TOURNIQUET CUFF) IMPLANT
DECANTER SPIKE VIAL GLASS SM (MISCELLANEOUS) IMPLANT
DRAIN TLS ROUND 10FR (DRAIN) IMPLANT
DRAPE OEC MINIVIEW 54X84 (DRAPES) ×2 IMPLANT
DRAPE U-SHAPE 47X51 STRL (DRAPES) ×2 IMPLANT
DRSG ADAPTIC 3X8 NADH LF (GAUZE/BANDAGES/DRESSINGS) ×2 IMPLANT
DVR VOLAR RIM NRW PLATE LEFT (Plate) ×2 IMPLANT
GAUZE SPONGE 4X4 12PLY STRL (GAUZE/BANDAGES/DRESSINGS) ×2 IMPLANT
GAUZE XEROFORM 5X9 LF (GAUZE/BANDAGES/DRESSINGS) ×2 IMPLANT
GLOVE SS BIOGEL STRL SZ 8 (GLOVE) ×1 IMPLANT
GLOVE SUPERSENSE BIOGEL SZ 8 (GLOVE) ×1
GOWN STRL REUS W/ TWL LRG LVL3 (GOWN DISPOSABLE) ×3 IMPLANT
GOWN STRL REUS W/ TWL XL LVL3 (GOWN DISPOSABLE) ×3 IMPLANT
GOWN STRL REUS W/TWL LRG LVL3 (GOWN DISPOSABLE) ×4
GOWN STRL REUS W/TWL XL LVL3 (GOWN DISPOSABLE) ×2
KIT BASIN OR (CUSTOM PROCEDURE TRAY) ×2 IMPLANT
KIT TURNOVER KIT B (KITS) ×2 IMPLANT
MANIFOLD NEPTUNE II (INSTRUMENTS) ×2 IMPLANT
NEEDLE 22X1 1/2 (OR ONLY) (NEEDLE) IMPLANT
NS IRRIG 1000ML POUR BTL (IV SOLUTION) ×2 IMPLANT
PACK ORTHO EXTREMITY (CUSTOM PROCEDURE TRAY) ×2 IMPLANT
PAD ARMBOARD 7.5X6 YLW CONV (MISCELLANEOUS) ×4 IMPLANT
PAD CAST 4YDX4 CTTN HI CHSV (CAST SUPPLIES) ×3 IMPLANT
PADDING CAST COTTON 4X4 STRL (CAST SUPPLIES) ×2
PEG LOCKING SMOOTH 2.2X16 (Screw) ×4 IMPLANT
PEG LOCKING SMOOTH 2.2X18 (Peg) ×2 IMPLANT
PEG LOCKING SMOOTH 2.2X20 (Screw) ×1 IMPLANT
PEG LOCKING SMOOTH 2.2X22 (Screw) ×2 IMPLANT
PILLOW ARM CARTER ADULT (MISCELLANEOUS) ×1 IMPLANT
PLATE VOLAR RIM NRRW DVR LEFT (Plate) IMPLANT
SCREW LOCK 12X2.7X 3 LD (Screw) IMPLANT
SCREW LOCK 14X2.7X 3 LD TPR (Screw) IMPLANT
SCREW LOCK 20X2.7X 3 LD TPR (Screw) IMPLANT
SCREW LOCKING 2.7X12MM (Screw) ×6 IMPLANT
SCREW LOCKING 2.7X13MM (Screw) ×2 IMPLANT
SCREW LOCKING 2.7X14 (Screw) ×2 IMPLANT
SCREW LOCKING 2.7X20MM (Screw) ×2 IMPLANT
SOL PREP POV-IOD 4OZ 10% (MISCELLANEOUS) ×2 IMPLANT
SPLINT FIBERGLASS 3X12 (CAST SUPPLIES) ×1 IMPLANT
SPONGE LAP 4X18 RFD (DISPOSABLE) IMPLANT
SUT MNCRL AB 4-0 PS2 18 (SUTURE) ×1 IMPLANT
SUT PROLENE 3 0 PS 2 (SUTURE) IMPLANT
SUT PROLENE 4 0 PS 2 18 (SUTURE) ×2 IMPLANT
SUT VIC AB 3-0 FS2 27 (SUTURE) ×1 IMPLANT
SYR CONTROL 10ML LL (SYRINGE) IMPLANT
SYSTEM CHEST DRAIN TLS 7FR (DRAIN) IMPLANT
TOWEL GREEN STERILE (TOWEL DISPOSABLE) ×2 IMPLANT
TOWEL GREEN STERILE FF (TOWEL DISPOSABLE) ×2 IMPLANT
TUBE CONNECTING 12X1/4 (SUCTIONS) ×2 IMPLANT
TUBE EVACUATION TLS (MISCELLANEOUS) IMPLANT
UNDERPAD 30X36 HEAVY ABSORB (UNDERPADS AND DIAPERS) ×2 IMPLANT
WATER STERILE IRR 1000ML POUR (IV SOLUTION) ×2 IMPLANT

## 2020-10-18 NOTE — Progress Notes (Signed)
Dr. Jillyn Hidden made aware of patient's elevated BP on day of surgery.    No new orders given at this time.

## 2020-10-18 NOTE — Anesthesia Postprocedure Evaluation (Signed)
Anesthesia Post Note  Patient: Autumn Morrison  Procedure(s) Performed: OPEN REDUCTION INTERNAL FIXATION (ORIF) DISTAL RADIAL FRACTURE (Left Wrist)     Patient location during evaluation: PACU Anesthesia Type: MAC and Regional Level of consciousness: awake and alert Pain management: pain level controlled Vital Signs Assessment: post-procedure vital signs reviewed and stable Respiratory status: spontaneous breathing, nonlabored ventilation, respiratory function stable and patient connected to nasal cannula oxygen Cardiovascular status: stable and blood pressure returned to baseline Postop Assessment: no apparent nausea or vomiting Anesthetic complications: no   No complications documented.  Last Vitals:  Vitals:   10/18/20 1900 10/18/20 1915  BP: (!) 152/87 (!) 148/84  Pulse: 66 71  Resp: 17 17  Temp:  36.6 C  SpO2: 100% 99%    Last Pain:  Vitals:   10/18/20 1915  TempSrc:   PainSc: 0-No pain                 Ethie Curless

## 2020-10-18 NOTE — Progress Notes (Signed)
No orders at time of patient's arrival.  Dr. Amedeo Plenty called and notified.  Per Dr. Amedeo Plenty, will put in orders in a few minutes.

## 2020-10-18 NOTE — Anesthesia Procedure Notes (Signed)
Anesthesia Regional Block: Supraclavicular block   Pre-Anesthetic Checklist: ,, timeout performed, Correct Patient, Correct Site, Correct Laterality, Correct Procedure, Correct Position, site marked, Risks and benefits discussed,  Surgical consent,  Pre-op evaluation,  At surgeon's request and post-op pain management  Laterality: Upper and Left  Prep: chloraprep       Needles:  Injection technique: Single-shot  Needle Type: Echogenic Stimulator Needle     Needle Length: 9cm  Needle Gauge: 20   Needle insertion depth: 2 cm   Additional Needles:   Procedures:,,,, ultrasound used (permanent image in chart),,,,  Narrative:  Start time: 10/18/2020 2:30 PM End time: 10/18/2020 2:40 PM Injection made incrementally with aspirations every 5 mL.  Performed by: Personally  Anesthesiologist: Lyn Hollingshead, MD

## 2020-10-18 NOTE — Progress Notes (Signed)
Orthopedic Tech Progress Note Patient Details:  Autumn Morrison 1961/03/07 [id number]  Ortho Devices Type of Ortho Device: Arm sling Ortho Device/Splint Interventions: Ordered   Post Interventions Patient Tolerated: Other (comment) Instructions Provided: Other (comment)   [name] 10/18/2020, 7:59 PM

## 2020-10-18 NOTE — Anesthesia Preprocedure Evaluation (Addendum)
Anesthesia Evaluation  Patient identified by MRN, date of birth, ID band Patient awake    Reviewed: Allergy & Precautions, NPO status , Patient's Chart, lab work & pertinent test results  Airway Mallampati: I       Dental no notable dental hx.    Pulmonary neg pulmonary ROS,    Pulmonary exam normal        Cardiovascular hypertension, Normal cardiovascular exam     Neuro/Psych negative neurological ROS  negative psych ROS   GI/Hepatic negative GI ROS, Neg liver ROS,   Endo/Other  negative endocrine ROS  Renal/GU negative Renal ROS  negative genitourinary   Musculoskeletal   Abdominal Normal abdominal exam  (+)   Peds  Hematology negative hematology ROS (+)   Anesthesia Other Findings   Reproductive/Obstetrics                             Anesthesia Physical Anesthesia Plan  ASA: II  Anesthesia Plan: MAC   Post-op Pain Management:  Regional for Post-op pain   Induction:   PONV Risk Score and Plan: 3 and Ondansetron, Dexamethasone and Midazolam  Airway Management Planned: Natural Airway and Simple Face Mask  Additional Equipment: None  Intra-op Plan:   Post-operative Plan:   Informed Consent: I have reviewed the patients History and Physical, chart, labs and discussed the procedure including the risks, benefits and alternatives for the proposed anesthesia with the patient or authorized representative who has indicated his/her understanding and acceptance.       Plan Discussed with: CRNA  Anesthesia Plan Comments:         Anesthesia Quick Evaluation

## 2020-10-18 NOTE — Op Note (Signed)
Operative note  Roseanne Kaufman MD  Preoperative diagnosis: Left Distal radius fracture comminuted complex greater than 3 part intra-articular  Postop diagnosis: Same  Procedure: #1 Left wrist open reduction internal fixation comminuted complex distal radius fracture with DVR [employer] cross lock plate and screw construct.  This was a greater than 3 part intra-articular fracture.  #2 AP lateral and oblique x-rays performed examined and interpreted by myself  Morrill Bomkamp MD  Anesthesia:block with IV sedation  Estimated blood loss minimal  Complications none immediate  Operative indications the patient presents for evaluation and surgical care.  Patient understands risk benefits and desires to proceed.  We have discussed with the patient all issues plans and concerns with this in mind we will proceed accordingly. We are planning surgery for your upper extremity. The risk and benefits of surgery to include risk of bleeding, infection, anesthesia,  damage to normal structures and failure of the surgery to accomplish its intended goals of relieving symptoms and restoring function have been discussed in detail. With this in mind we plan to proceed. I have specifically discussed with the patient the pre-and postoperative regime and the dos and don'ts and risk and benefits in great detail. Risk and benefits of surgery also include risk of dystrophy(CRPS), chronic nerve pain, failure of the healing process to go onto completion and other inherent risks of surgery The relavent the pathophysiology of the disease/injury process, as well as the alternatives for treatment and postoperative course of action has been discussed in great detail with the patient who desires to proceed.  We will do everything in our power to help you (the patient) restore function to the upper extremity. It is a pleasure to see this patient today.    Operative procedure: Patient was seen by myself and anesthesia.  Appropriate  anesthesia was induced and following this the patient was prepped with a Hibiclens pre-scrub followed by 10-minute surgical Betadine scrub and paint.  Once this was completed the extremity was elevated and the tourniquet was insufflated to 250 mmHg.  Timeout was observed preoperative antibiotics were given and the patient then underwent a very careful and cautious approach to the extremity with volar radial incision under 250 mm tourniquet control.  FCR tendon sheath was identified and dissected.  There were no complicating features.  Once this was completed the carpal canal contents were retracted ulnarly and the FCR was retracted radially.  We took very meticulous care of the radial artery and the carpal canal contents during the approach.  The pronator was accessed incised and lifted off of the fracture.  The fracture was then reassembled with standard orthopedic equipment and a DVR plate volar rim and screw construct from [employer] was accomplished in terms of placement and fixation of the fracture.  Adequate radial height, volar tilt and radial inclination was restored.  The distal radial ulnar joint, radiocarpal and midcarpal joints all were  stable and satisfactory.  We irrigated copiously and closed the pronator with 3-0 Vicryl followed by closure of the skin edge with Prolene.  Once again, the distal radius underwent open reduction internal fixation without complications.  The distal radial ulnar joint was stable.  The patient had no complications.  All radiographic parameters look quite well following the fixation.  Standard dressing of Adaptic Xeroform 4 x 4's gauze web roll Kerlix and a volar splint were applied.  The patient understands instructions of elevate move massage fingers notify us any problems occur and follow-up care according to our standard protocol for  a DVR plate and screw construct.  He has been a pleasure participate in the patient's care and we look forward to spent in the  patient's recovery.  Roseanne Kaufman MD

## 2020-10-18 NOTE — Discharge Instructions (Signed)

## 2020-10-18 NOTE — H&P (Signed)
Autumn Morrison is an 60 y.o. female.   Chief Complaint: Left distal radius fracture displaced in nature HPI: We will plan for open reduction internal fixation left distal radius fracture with repair is necessary.  I discussed with patient all issues.  We will plan to proceed to music immediate time as soon as possible.  Patient presents for evaluation and treatment of the of their upper extremity predicament. The patient denies neck, back, chest or  abdominal pain. The patient notes that they have no lower extremity problems. The patients primary complaint is noted. We are planning surgical care pathway for the upper extremity.  Past Medical History:  Diagnosis Date  . Arthritis   . Cancer ([place])    Melanoma  . Elevated cholesterol   . Hypertension   . Osteoporosis 02/2018   T score -2.3 DEXA 2014 T score -2.5    Past Surgical History:  Procedure Laterality Date  . COLONOSCOPY    . Melanoma excised    . PELVIC LAPAROSCOPY     DL with tubal studies    Family History  Problem Relation Age of Onset  . Hypertension Mother   . Diabetes Mother   . Hyperlipidemia Mother   . Heart disease Father   . Hypertension Sister   . Hypertension Brother   . Colon cancer Neg Hx    Social History:  reports that she has never smoked. She has never used smokeless tobacco. She reports previous alcohol use. She reports that she does not use drugs.  Allergies: No Known Allergies  Medications Prior to Admission  Medication Sig Dispense Refill  . alendronate (FOSAMAX) 70 MG tablet TAKE WITH A FULL GLASS OF WATER ON AN EMPTY STOMACH. (ONCE WEEKLY OR AS DIRECTED) (Patient taking differently: Take 70 mg by mouth once a week. TAKE WITH A FULL GLASS OF WATER ON AN EMPTY STOMACH. () 12 tablet 4  . cephALEXin (KEFLEX) 500 MG capsule Take 500 mg by mouth 4 (four) times daily.    . rosuvastatin (CRESTOR) 10 MG tablet Take 10 mg by mouth daily.    . montelukast (SINGULAIR) 10 MG tablet Take 1 tablet by  mouth daily as needed (allergies).    . ondansetron (ZOFRAN) 8 MG tablet Take 8 mg by mouth 3 (three) times daily.    Marland Kitchen oxyCODONE (OXY IR/ROXICODONE) 5 MG immediate release tablet Take 5 mg by mouth daily as needed.      Results for orders placed or performed during the hospital encounter of 10/18/20 (from the past 48 hour(s))  SARS Coronavirus 2 by RT PCR (hospital order, performed in [facility] hospital lab) Nasopharyngeal Nasopharyngeal Swab     Status: None   Collection Time: 10/18/20 12:17 PM   Specimen: Nasopharyngeal Swab  Result Value Ref Range   SARS Coronavirus 2 NEGATIVE NEGATIVE    Comment: (NOTE) SARS-CoV-2 target nucleic acids are NOT DETECTED.  The SARS-CoV-2 RNA is generally detectable in upper and lower respiratory specimens during the acute phase of infection. The lowest concentration of SARS-CoV-2 viral copies this assay can detect is 250 copies / mL. A negative result does not preclude SARS-CoV-2 infection and should not be used as the sole basis for treatment or other patient management decisions.  A negative result may occur with improper specimen collection / handling, submission of specimen other than nasopharyngeal swab, presence of viral mutation(s) within the areas targeted by this assay, and inadequate number of viral copies (<250 copies / mL). A negative result must be combined with  clinical observations, patient history, and epidemiological information.  Fact Sheet for Patients:   StrictlyIdeas.no  Fact Sheet for Healthcare Providers: BankingDealers.co.za  This test is not yet approved or  cleared by the [place] FDA and has been authorized for detection and/or diagnosis of SARS-CoV-2 by FDA under an Emergency Use Authorization (EUA).  This EUA will remain in effect (meaning this test can be used) for the duration of the COVID-19 declaration under Section 564(b)(1) of the Act, 21 U.S.C. section  360bbb-3(b)(1), unless the authorization is terminated or revoked sooner.  Performed at [facility], [place] [address], [place], [place] [address]   Basic metabolic panel per protocol     Status: None   Collection Time: 10/18/20 12:21 PM  Result Value Ref Range   Sodium 140 135 - 145 mmol/L   Potassium 3.8 3.5 - 5.1 mmol/L   Chloride 105 98 - 111 mmol/L   CO2 25 22 - 32 mmol/L   Glucose, Bld 81 70 - 99 mg/dL    Comment: Glucose reference range applies only to samples taken after fasting for at least 8 hours.   BUN 11 6 - 20 mg/dL   Creatinine, Ser 0.56 0.44 - 1.00 mg/dL   Calcium 9.0 8.9 - 10.3 mg/dL   GFR, Estimated >60 >60 mL/min    Comment: (NOTE) Calculated using the CKD-EPI Creatinine Equation (2021)    Anion gap 10 5 - 15    Comment: Performed at [facility] [address], [place], [place] [address]   DG MINI C-ARM IMAGE ONLY  Result Date: 10/18/2020 There is no interpretation for this exam.  This order is for images obtained during a surgical procedure.  Please See "Surgeries" Tab for more information regarding the procedure.    Review of Systems  Blood pressure (!) 149/56, pulse 77, temperature 98.3 F (36.8 C), temperature source Oral, resp. rate 11, height 5\' 4"  (1.626 m), weight 54.4 kg, last menstrual period 03/21/2011, SpO2 99 %. Physical Exam  Distal radius fracture left upper extremity displaced in nature.  Will plan for open reduction internal fixation.  She is neurovascularly intact.  The patient is alert and oriented in no acute distress. The patient complains of pain in the affected upper extremity.  The patient is noted to have a normal HEENT exam. Lung fields show equal chest expansion and no shortness of breath. Abdomen exam is nontender without distention. Lower extremity examination does not show any fracture dislocation or blood clot symptoms. Pelvis is stable and the neck and back are stable and nontender. Assessment/Plan We will plan  for open reduction internal fixation distal radius fracture left upper extremity.  Patient understands all risk and benefits and will proceed accordingly.  We are planning surgery for your upper extremity. The risk and benefits of surgery to include risk of bleeding, infection, anesthesia,  damage to normal structures and failure of the surgery to accomplish its intended goals of relieving symptoms and restoring function have been discussed in detail. With this in mind we plan to proceed. I have specifically discussed with the patient the pre-and postoperative regime and the dos and don'ts and risk and benefits in great detail. Risk and benefits of surgery also include risk of dystrophy(CRPS), chronic nerve pain, failure of the healing process to go onto completion and other inherent risks of surgery The relavent the pathophysiology of the disease/injury process, as well as the alternatives for treatment and postoperative course of action has been discussed in great  detail with the patient who desires to proceed.  We will do everything in our power to help you (the patient) restore function to the upper extremity. It is a pleasure to see this patient today.   Willa Frater III, MD 10/18/2020, 4:22 PM

## 2020-10-18 NOTE — Anesthesia Procedure Notes (Signed)
Procedure Name: MAC Date/Time: 10/18/2020 5:15 PM Performed by: Barrington Ellison, CRNA Pre-anesthesia Checklist: Patient identified, Emergency Drugs available, Suction available and Patient being monitored Patient Re-evaluated:Patient Re-evaluated prior to induction Oxygen Delivery Method: Simple face mask

## 2020-10-18 NOTE — Transfer of Care (Signed)
Immediate Anesthesia Transfer of Care Note  Patient: Autumn Morrison  Procedure(s) Performed: OPEN REDUCTION INTERNAL FIXATION (ORIF) DISTAL RADIAL FRACTURE (Left Wrist)  Patient Location: PACU  Anesthesia Type:MAC and Regional  Level of Consciousness: lethargic  Airway & Oxygen Therapy: Patient Spontanous Breathing  Post-op Assessment: Report given to RN  Post vital signs: Reviewed and stable  Last Vitals:  Vitals Value Taken Time  BP    Temp    Pulse    Resp    SpO2      Last Pain:  Vitals:   10/18/20 1253  TempSrc:   PainSc: 3       Patients Stated Pain Goal: 1 (27/74/12 8786)  Complications: No complications documented.

## 2020-10-19 ENCOUNTER — Encounter (HOSPITAL_COMMUNITY): Payer: Self-pay | Admitting: Orthopedic Surgery

## 2021-04-05 ENCOUNTER — Other Ambulatory Visit: Payer: Self-pay | Admitting: Family Medicine

## 2021-04-05 DIAGNOSIS — Z1231 Encounter for screening mammogram for malignant neoplasm of breast: Secondary | ICD-10-CM

## 2021-04-27 ENCOUNTER — Ambulatory Visit (INDEPENDENT_AMBULATORY_CARE_PROVIDER_SITE_OTHER): Payer: 59 | Admitting: Nurse Practitioner

## 2021-04-27 ENCOUNTER — Telehealth: Payer: Self-pay | Admitting: *Deleted

## 2021-04-27 ENCOUNTER — Other Ambulatory Visit: Payer: Self-pay

## 2021-04-27 ENCOUNTER — Encounter: Payer: Self-pay | Admitting: Nurse Practitioner

## 2021-04-27 VITALS — BP 120/76 | Ht 63.5 in | Wt 119.0 lb

## 2021-04-27 DIAGNOSIS — Z01419 Encounter for gynecological examination (general) (routine) without abnormal findings: Secondary | ICD-10-CM | POA: Diagnosis not present

## 2021-04-27 DIAGNOSIS — M810 Age-related osteoporosis without current pathological fracture: Secondary | ICD-10-CM

## 2021-04-27 DIAGNOSIS — Z780 Asymptomatic menopausal state: Secondary | ICD-10-CM | POA: Diagnosis not present

## 2021-04-27 NOTE — Telephone Encounter (Signed)
[employer] insurance verification has been sent awaiting Summary of benefits  

## 2021-04-27 NOTE — Progress Notes (Signed)
   Autumn Morrison May 03, 1961 [id number]   History:  60 y.o. G2P2002 presents for annual exam. Postmenopausal - no HRT, no bleeding. Normal pap and mammogram history. History of osteoporosis, has been on Fosamax since 2014. 10/2020 ORIF of left wrist from fall. History of Melanoma., HTN, HLD.   Gynecologic History Patient's last menstrual period was 03/21/2011.   Contraception/Family planning: post menopausal status Sexually active: No  Health Maintenance Last Pap: 03/11/2019. Results were: Normal, 5-year repeat Last mammogram: 04/29/2020. Results were: Normal Last colonoscopy: 2017. Results were: Normal, 10-year repeat Last Dexa: 06/21/2020. Results were: T-score -2.4  Past medical history, past surgical history, family history and social history were all reviewed and documented in the EPIC chart. Widowed. Works at [employer]. 2 daughters, oldest graduated from [employer] 58/2022 in PT. Youngest daughter is Paramedic at [employer].  ROS:  A ROS was performed and pertinent positives and negatives are included.  Exam:  Vitals:   04/27/21 0837  BP: 120/76  Weight: 119 lb (54 kg)  Height: 5' 3.5" (1.613 m)   Body mass index is 20.75 kg/m.  General appearance:  Normal Thyroid:  Symmetrical, normal in size, without palpable masses or nodularity. Respiratory  Auscultation:  Clear without wheezing or rhonchi Cardiovascular  Auscultation:  Regular rate, without rubs, murmurs or gallops  Edema/varicosities:  Not grossly evident Abdominal  Soft,nontender, without masses, guarding or rebound.  Liver/spleen:  No organomegaly noted  Hernia:  None appreciated  Skin  Inspection:  Grossly normal Breasts: Examined lying and sitting.   Right: Without masses, retractions, nipple discharge or axillary adenopathy.   Left: Without masses, retractions, nipple discharge or axillary adenopathy. Genitourinary   Inguinal/mons:  Normal without inguinal adenopathy  External genitalia:  Normal appearing vulva  with no masses, tenderness, or lesions  BUS/Urethra/Skene's glands:  Normal  Vagina:  Atrophic changes  Cervix:  Normal appearing without discharge or lesions  Uterus:  Normal in size, shape and contour.  Midline and mobile, nontender  Adnexa/parametria:     Rt: Normal in size, without masses or tenderness.   Lt: Normal in size, without masses or tenderness.  Anus and perineum: Normal  Digital rectal exam: Normal sphincter tone without palpated masses or tenderness  Patient informed chaperone available to be present for breast and pelvic exam. Patient has requested no chaperone to be present. Patient has been advised what will be completed during breast and pelvic exam.   Assessment/Plan:  60 y.o. G2P2002 for annual exam.   Well female exam with routine gynecological exam - Education provided on SBEs, importance of preventative screenings, current guidelines, high calcium diet, regular exercise, and multivitamin daily. Labs with PCP.  Postmenopausal - No HRT, no bleeding.   Age-related osteoporosis without current pathological fracture - Has been on Fosamax since 2014. 10/2020 ORIF of left wrist from fall. Recommend changing treatment due to recent fracture and long-term use of Fosamax. She is agreeable. We discussed management options and she is interested in Prolia. Will repeat Dexa in 1 year.   Screening for cervical cancer - Normal Pap history.  Will repeat at 5-year interval per guidelines.  Screening for breast cancer - Normal mammogram history.  Continue annual screenings.  Normal breast exam today.  Screening for colon cancer - 2017 colonoscopy. Will repeat at GI's recommended interval.   Return in 1 year for annual.   Tamela Gammon DNP, 9:02 AM 04/27/2021

## 2021-04-27 NOTE — Telephone Encounter (Signed)
-----   Message from Tamela Gammon, NP sent at 04/27/2021  9:00 AM EDT ----- Regarding: Prolia It is recommended this patient change her current osteoporosis treatment due to recent fracture. Please start the process for Prolia. Thank you.

## 2021-04-28 ENCOUNTER — Ambulatory Visit: Payer: 59 | Admitting: Nurse Practitioner

## 2021-04-28 ENCOUNTER — Encounter: Payer: 59 | Admitting: Obstetrics and Gynecology

## 2021-05-01 ENCOUNTER — Ambulatory Visit: Payer: 59

## 2021-05-03 NOTE — Telephone Encounter (Addendum)
Deductible $100 ($100 met)  OOP MAX $800 ($800 met)  Annual exam 04/27/2021  Calcium   9.0          Date 10/18/2020  Upcoming dental procedures NO  Prior Authorization needed yes approved 05/04/2021-08-01-2021 scanned in system  Pt estimated Cost $0   APPT 05/19/2021    Coverage Details: 0% ONE DOSE, 0% ADMIN FEE

## 2021-05-19 ENCOUNTER — Ambulatory Visit: Payer: 59

## 2021-05-23 ENCOUNTER — Other Ambulatory Visit: Payer: Self-pay

## 2021-05-23 ENCOUNTER — Ambulatory Visit (INDEPENDENT_AMBULATORY_CARE_PROVIDER_SITE_OTHER): Payer: 59

## 2021-05-23 DIAGNOSIS — M810 Age-related osteoporosis without current pathological fracture: Secondary | ICD-10-CM

## 2021-05-23 MED ORDER — DENOSUMAB 60 MG/ML ~~LOC~~ SOSY
60.0000 mg | PREFILLED_SYRINGE | Freq: Once | SUBCUTANEOUS | Status: AC
Start: 2021-05-23 — End: 2021-05-23
  Administered 2021-05-23: 60 mg via SUBCUTANEOUS

## 2021-06-02 ENCOUNTER — Other Ambulatory Visit: Payer: Self-pay

## 2021-06-02 ENCOUNTER — Ambulatory Visit
Admission: RE | Admit: 2021-06-02 | Discharge: 2021-06-02 | Disposition: A | Discharge status: Home / Self Care | Payer: 59 | Source: Ambulatory Visit | Attending: Family Medicine | Admitting: Family Medicine

## 2021-06-02 DIAGNOSIS — Z1231 Encounter for screening mammogram for malignant neoplasm of breast: Secondary | ICD-10-CM

## 2021-07-05 NOTE — Telephone Encounter (Signed)
PROLIA GIVEN 05/19/2021 NEXT INJECTION 11/17/2021

## 2021-07-06 ENCOUNTER — Other Ambulatory Visit: Payer: Self-pay

## 2021-07-06 ENCOUNTER — Emergency Department (HOSPITAL_BASED_OUTPATIENT_CLINIC_OR_DEPARTMENT_OTHER)
Admission: EM | Admit: 2021-07-06 | Discharge: 2021-07-07 | Disposition: A | Discharge status: Home / Self Care | Payer: 59 | Attending: Emergency Medicine | Admitting: Emergency Medicine

## 2021-07-06 ENCOUNTER — Encounter (HOSPITAL_BASED_OUTPATIENT_CLINIC_OR_DEPARTMENT_OTHER): Payer: Self-pay

## 2021-07-06 ENCOUNTER — Emergency Department (HOSPITAL_BASED_OUTPATIENT_CLINIC_OR_DEPARTMENT_OTHER): Payer: 59

## 2021-07-06 DIAGNOSIS — N3000 Acute cystitis without hematuria: Secondary | ICD-10-CM | POA: Diagnosis not present

## 2021-07-06 DIAGNOSIS — Z85828 Personal history of other malignant neoplasm of skin: Secondary | ICD-10-CM | POA: Insufficient documentation

## 2021-07-06 DIAGNOSIS — I10 Essential (primary) hypertension: Secondary | ICD-10-CM | POA: Diagnosis not present

## 2021-07-06 DIAGNOSIS — R350 Frequency of micturition: Secondary | ICD-10-CM | POA: Diagnosis present

## 2021-07-06 LAB — CBC WITH DIFFERENTIAL/PLATELET
Abs Immature Granulocytes: 0.02 10*3/uL (ref 0.00–0.07)
Basophils Absolute: 0.1 10*3/uL (ref 0.0–0.1)
Basophils Relative: 1 %
Eosinophils Absolute: 0 10*3/uL (ref 0.0–0.5)
Eosinophils Relative: 0 %
HCT: 43.1 % (ref 36.0–46.0)
Hemoglobin: 14.3 g/dL (ref 12.0–15.0)
Immature Granulocytes: 0 %
Lymphocytes Relative: 20 %
Lymphs Abs: 1.9 10*3/uL (ref 0.7–4.0)
MCH: 29.4 pg (ref 26.0–34.0)
MCHC: 33.2 g/dL (ref 30.0–36.0)
MCV: 88.5 fL (ref 80.0–100.0)
Monocytes Absolute: 0.7 10*3/uL (ref 0.1–1.0)
Monocytes Relative: 7 %
Neutro Abs: 7 10*3/uL (ref 1.7–7.7)
Neutrophils Relative %: 72 %
Platelets: 219 10*3/uL (ref 150–400)
RBC: 4.87 MIL/uL (ref 3.87–5.11)
RDW: 12.5 % (ref 11.5–15.5)
WBC: 9.7 10*3/uL (ref 4.0–10.5)
nRBC: 0 % (ref 0.0–0.2)

## 2021-07-06 LAB — BASIC METABOLIC PANEL
Anion gap: 8 (ref 5–15)
BUN: 13 mg/dL (ref 6–20)
CO2: 27 mmol/L (ref 22–32)
Calcium: 9.7 mg/dL (ref 8.9–10.3)
Chloride: 100 mmol/L (ref 98–111)
Creatinine, Ser: 0.62 mg/dL (ref 0.44–1.00)
GFR, Estimated: >60 mL/min (ref >60)
Glucose, Bld: 85 mg/dL (ref 70–99)
Potassium: 3.7 mmol/L (ref 3.5–5.1)
Sodium: 135 mmol/L (ref 135–145)

## 2021-07-06 LAB — URINALYSIS, ROUTINE W REFLEX MICROSCOPIC
Bilirubin Urine: NEGATIVE
Glucose, UA: NEGATIVE mg/dL
Ketones, ur: NEGATIVE mg/dL
Nitrite: NEGATIVE
Protein, ur: NEGATIVE mg/dL
Specific Gravity, Urine: <1.005 — ABNORMAL LOW (ref 1.005–1.030)
pH: 7 (ref 5.0–8.0)

## 2021-07-06 MED ORDER — IOHEXOL 350 MG/ML SOLN
100.0000 mL | Freq: Once | INTRAVENOUS | Status: AC | PRN
  Administered 2021-07-06: 100 mL via INTRAVENOUS

## 2021-07-06 MED ORDER — SODIUM CHLORIDE 0.9 % IV SOLN
2.0000 g | Freq: Once | INTRAVENOUS | Status: AC
  Administered 2021-07-07: 2 g via INTRAVENOUS
  Filled 2021-07-06: qty 20

## 2021-07-06 MED ORDER — FENTANYL CITRATE PF 50 MCG/ML IJ SOSY
50.0000 ug | PREFILLED_SYRINGE | Freq: Once | INTRAMUSCULAR | Status: DC
  Filled 2021-07-06: qty 1

## 2021-07-06 NOTE — ED Triage Notes (Signed)
Patient here POV from Home with Urinary Frequency and Lower Back Pain.  Symptoms began yesterday and have progressed since. No Fevers.   NAD Noted during Triage. A&Ox4. GCS 15. Ambulatory.

## 2021-07-06 NOTE — ED Notes (Signed)
Patient transported to CT 

## 2021-07-06 NOTE — ED Notes (Signed)
Pt bladder scanned. 20 mL urine remaining. (x3 for confirmation). Secure message sent to Dr. Dayna Barker

## 2021-07-06 NOTE — ED Provider Notes (Signed)
[facility] EMER[facility]ENC[facility] DEPT Provider Note   CSN: [id number] Arrival date & time: 07/06/21  2050     History Chief Complaint  Patient presents with   Urinary Frequency    Autumn Morrison is a 60 y.o. female.   Urinary Frequency This is a new problem. The current episode started yesterday. The problem occurs constantly. The problem has not changed since onset.Pertinent negatives include no chest pain, no abdominal pain, no headaches and no shortness of breath. Nothing aggravates the symptoms. Nothing relieves the symptoms. She has tried nothing for the symptoms. The treatment provided no relief.      Past Medical History:  Diagnosis Date   Arthritis    Cancer ([place])    Melanoma   Elevated cholesterol    Hypertension    Osteoporosis 02/2018   T score -2.3 DEXA 2014 T score -2.5    Patient Active Problem List   Diagnosis Date Noted   Osteoporosis 05/21/2013   Hypertension    Elevated cholesterol     Past Surgical History:  Procedure Laterality Date   COLONOSCOPY     Melanoma excised     OPEN REDUCTION INTERNAL FIXATION (ORIF) DISTAL RADIAL FRACTURE Left 10/18/2020   Procedure: OPEN REDUCTION INTERNAL FIXATION (ORIF) DISTAL RADIAL FRACTURE;  Surgeon: Roseanne Kaufman, MD;  Location: [facility];  Service: Orthopedics;  Laterality: Left;  1 hr Block anesthesia   PELVIC LAPAROSCOPY     DL with tubal studies     OB History     Gravida  2   Para  2   Term  2   Preterm      AB      Living  2      SAB      IAB      Ectopic      Multiple      Live Births              Family History  Problem Relation Age of Onset   Hypertension Mother    Diabetes Mother    Hyperlipidemia Mother    Heart disease Father    Hypertension Sister    Hypertension Brother    Colon cancer Neg Hx     Social History   Tobacco Use   Smoking status: Never   Smokeless tobacco: Never  Vaping Use   Vaping Use: Never used  Substance Use Topics   Alcohol use:  Yes    Comment: rare   Drug use: No    Home Medications Prior to Admission medications   Medication Sig Start Date End Date Taking? Authorizing Provider  cephALEXin (KEFLEX) 500 MG capsule Take 1 capsule (500 mg total) by mouth 4 (four) times daily for 10 days. 07/07/21 07/17/21 Yes Antoinett Dorman, Corene Cornea, MD  phenazopyridine (PYRIDIUM) 200 MG tablet Take 1 tablet (200 mg total) by mouth 3 (three) times daily as needed for pain. 07/07/21  Yes Sanjuanita Condrey, Corene Cornea, MD  alendronate (FOSAMAX) 70 MG tablet TAKE WITH A FULL GLASS OF WATER ON AN EMPTY STOMACH. (ONCE WEEKLY OR AS DIRECTED) Patient taking differently: Take 70 mg by mouth once a week. TAKE WITH A FULL GLASS OF WATER ON AN EMPTY STOMACH. ( 04/03/19   Fontaine, Belinda Block, MD  montelukast (SINGULAIR) 10 MG tablet Take 1 tablet by mouth daily as needed (allergies). 03/30/20   [provider]  rosuvastatin (CRESTOR) 10 MG tablet Take 10 mg by mouth daily.    [provider]  terbinafine (LAMISIL) 250 MG tablet  Take 250 mg by mouth daily.    [provider]    Allergies    Patient has no known allergies.  Review of Systems   Review of Systems  Respiratory:  Negative for shortness of breath.   Cardiovascular:  Negative for chest pain.  Gastrointestinal:  Negative for abdominal pain.  Genitourinary:  Positive for frequency.  Neurological:  Negative for headaches.  All other systems reviewed and are negative.  Physical Exam Updated Vital Signs BP (!) 158/79   Pulse 80   Temp 98.3 F (36.8 C)   Resp 20   Ht 5' 3.5" (1.613 m)   Wt 54 kg   LMP 03/21/2011   SpO2 95%   BMI 20.76 kg/m   Physical Exam Vitals and nursing note reviewed.  Constitutional:      Appearance: She is well-developed.  HENT:     Head: Normocephalic and atraumatic.     Nose: No congestion or rhinorrhea.     Mouth/Throat:     Mouth: Mucous membranes are moist.     Pharynx: Oropharynx is clear.  Eyes:     Pupils: Pupils are equal, round, and  reactive to light.  Cardiovascular:     Rate and Rhythm: Normal rate and regular rhythm.  Pulmonary:     Effort: Pulmonary effort is normal. No respiratory distress.     Breath sounds: No stridor.  Abdominal:     General: Abdomen is flat. There is no distension.  Musculoskeletal:        General: No swelling, tenderness or signs of injury. Normal range of motion.     Cervical back: Normal range of motion.  Skin:    General: Skin is warm and dry.  Neurological:     General: No focal deficit present.     Mental Status: She is alert.     Cranial Nerves: No cranial nerve deficit.     Sensory: No sensory deficit.    ED Results / Procedures / Treatments   Labs (all labs ordered are listed, but only abnormal results are displayed) Labs Reviewed  URINALYSIS, ROUTINE W REFLEX MICROSCOPIC - Abnormal; Notable for the following components:      Result Value   Specific Gravity, Urine <1.005 (*)    Hgb urine dipstick MODERATE (*)    Leukocytes,Ua LARGE (*)    All other components within normal limits  URINE CULTURE  BASIC METABOLIC PANEL  CBC WITH DIFFERENTIAL/PLATELET    EKG None  Radiology CT Angio Abd/Pel W and/or Wo Contrast  Result Date: 07/07/2021 CLINICAL DATA:  Abdominal pain, possible dissection, initial encounter EXAM: CTA ABDOMEN AND PELVIS WITHOUT AND WITH CONTRAST TECHNIQUE: Multidetector CT imaging of the abdomen and pelvis was performed using the standard protocol during bolus administration of intravenous contrast. Multiplanar reconstructed images and MIPs were obtained and reviewed to evaluate the vascular anatomy. CONTRAST:  157mL OMNIPAQUE IOHEXOL 350 MG/ML SOLN COMPARISON:  None. FINDINGS: VASCULAR Aorta: Abdominal aorta demonstrates atherosclerotic calcifications without aneurysmal dilatation or dissection. Celiac: Patent without evidence of aneurysm, dissection, vasculitis or significant stenosis. SMA: Patent without evidence of aneurysm, dissection, vasculitis or  significant stenosis. Renals: 3 renal arteries are noted on the right. Single renal artery on the left is seen. No focal stenosis is noted. IMA: Patent without evidence of aneurysm, dissection, vasculitis or significant stenosis. Inflow: Iliacs demonstrate mild atherosclerotic change without aneurysmal dilatation or dissection. No stenosis is seen. Veins: No specific venous abnormality is noted. Review of the MIP images confirms the above findings.  NON-VASCULAR Lower chest: Lung bases are free of acute infiltrate or sizable effusion. Hepatobiliary: No focal liver abnormality is seen. No gallstones, gallbladder wall thickening, or biliary dilatation. Pancreas: Unremarkable. No pancreatic ductal dilatation or surrounding inflammatory changes. Spleen: Normal in size without focal abnormality. Adrenals/Urinary Tract: Adrenal glands are within normal limits bilaterally. Kidneys demonstrate a normal enhancement pattern bilaterally. Mild fullness of the right renal pelvis is seen with normal appearing right ureter. Bladder is well distended. A tiny focus of air is noted within the bladder which may be related to recent instrumentation. Stomach/Bowel: Scattered fecal material is noted throughout the colon. No obstructive or inflammatory changes are seen. The appendix is within normal limits. No small bowel or gastric abnormality is noted. Lymphatic: No sizable lymphadenopathy is seen. Reproductive: Uterus and bilateral adnexa are unremarkable. Other: No abdominal wall hernia or abnormality. No abdominopelvic ascites. Musculoskeletal: No acute bony abnormality is noted. There is a rounded density in the inferior aspect of the right breast measuring 9 mm likely representing a fibroadenoma given findings of prior ultrasound and mammography. IMPRESSION: VASCULAR No evidence of abdominal aortic aneurysm or dissection. No vascular abnormality is noted. NON-VASCULAR No acute abnormality in the abdomen and pelvis is noted. Aortic  Atherosclerosis (ICD10-I70.0). Electronically Signed   By: Inez Catalina M.D.   On: 07/07/2021 00:17    Procedures Procedures   Medications Ordered in ED Medications  fentaNYL (SUBLIMAZE) injection 50 mcg (50 mcg Intravenous Patient Refused/Not Given 07/07/21 0024)  cefTRIAXone (ROCEPHIN) 2 g in sodium chloride 0.9 % 100 mL IVPB (0 g Intravenous Stopped 07/07/21 0112)  iohexol (OMNIPAQUE) 350 MG/ML injection 100 mL (100 mLs Intravenous Contrast Given 07/06/21 2356)  phenazopyridine (PYRIDIUM) tablet 100 mg (100 mg Oral Given 07/07/21 7544)    ED Course  I have reviewed the triage vital signs and the nursing notes.  Pertinent labs & imaging results that were available during my care of the patient were reviewed by me and considered in my medical decision making (see chart for details).    MDM Rules/Calculators/A&P                         Apparent UTI. Had HTN relative to pcp visit yesterday so ct angio performed to ensure that the fullness in her abdomen and back werent' secondary to aortic abnormality, kidney stone, renal infarct or other emergent cause and this is unremarkable. Will observe to ensure improving pressures and no other needs.   Pressures significantly improved. Pain improved. Will tx for UTI. Fu w/ PCP for recheck of BP and UA.   Final Clinical Impression(s) / ED Diagnoses Final diagnoses:  Acute cystitis without hematuria    Rx / DC Orders ED Discharge Orders          Ordered    cephALEXin (KEFLEX) 500 MG capsule  4 times daily        07/07/21 0216    phenazopyridine (PYRIDIUM) 200 MG tablet  3 times daily PRN        07/07/21 0216             Kerrington Greenhalgh, Corene Cornea, MD 07/07/21 [phone]

## 2021-07-07 MED ORDER — PHENAZOPYRIDINE HCL 200 MG PO TABS: 200.0000 mg | ORAL_TABLET | Freq: Three times a day (TID) | ORAL | 0 refills | Status: AC | PRN

## 2021-07-07 MED ORDER — CEPHALEXIN 500 MG PO CAPS: 500.0000 mg | ORAL_CAPSULE | Freq: Four times a day (QID) | ORAL | 0 refills | Status: AC

## 2021-07-07 MED ORDER — PHENAZOPYRIDINE HCL 100 MG PO TABS
95.0000 mg | ORAL_TABLET | Freq: Once | ORAL | Status: AC
  Administered 2021-07-07: 100 mg via ORAL
  Filled 2021-07-07: qty 1

## 2021-07-09 LAB — URINE CULTURE: Culture: 80000 — AB

## 2021-11-30 ENCOUNTER — Telehealth: Payer: Self-pay | Admitting: *Deleted

## 2021-11-30 NOTE — Telephone Encounter (Signed)
Left message for pt to rerun our call. We need updated insurance information. ? ? ? ? ? ? ?

## 2021-12-05 NOTE — Telephone Encounter (Signed)
Pt states she will give [place] a call back. Patient believes she will be switching practices due to new insurance. ?

## 2022-04-27 ENCOUNTER — Telehealth: Payer: Self-pay | Admitting: *Deleted

## 2022-04-27 NOTE — Telephone Encounter (Signed)
Follow up regarding Prolia and new insurance Pt will be going to another system due to insurance

## 2022-06-13 NOTE — Telephone Encounter (Signed)
Pt will transfer to another office due to insurance
